# Patient Record
Sex: Male | Born: 1955 | Race: White | Hispanic: No | Marital: Married | Smoking: Former smoker
Health system: Southern US, Community
[De-identification: ages and names within clinical notes are randomized; demographics above are authoritative.]

## PROBLEM LIST (undated history)

## (undated) DIAGNOSIS — R319 Hematuria, unspecified: Secondary | ICD-10-CM

## (undated) DIAGNOSIS — L509 Urticaria, unspecified: Secondary | ICD-10-CM

## (undated) DIAGNOSIS — K219 Gastro-esophageal reflux disease without esophagitis: Secondary | ICD-10-CM

## (undated) DIAGNOSIS — Z950 Presence of cardiac pacemaker: Secondary | ICD-10-CM

## (undated) DIAGNOSIS — I10 Essential (primary) hypertension: Secondary | ICD-10-CM

## (undated) DIAGNOSIS — J45909 Unspecified asthma, uncomplicated: Secondary | ICD-10-CM

## (undated) DIAGNOSIS — G4730 Sleep apnea, unspecified: Secondary | ICD-10-CM

## (undated) DIAGNOSIS — M1990 Unspecified osteoarthritis, unspecified site: Secondary | ICD-10-CM

## (undated) HISTORY — PX: FOOT SURGERY: SHX648

## (undated) HISTORY — PX: INSERT / REPLACE / REMOVE PACEMAKER: SUR710

## (undated) HISTORY — PX: TONSILLECTOMY: SUR1361

## (undated) HISTORY — PX: APPENDECTOMY: SHX54

---

## 2004-12-24 ENCOUNTER — Emergency Department: Payer: Self-pay | Admitting: Emergency Medicine

## 2005-01-01 ENCOUNTER — Other Ambulatory Visit: Payer: Self-pay

## 2005-01-04 ENCOUNTER — Ambulatory Visit: Payer: Self-pay | Admitting: Podiatry

## 2006-12-22 ENCOUNTER — Ambulatory Visit: Payer: Self-pay | Admitting: Family Medicine

## 2007-04-17 ENCOUNTER — Ambulatory Visit: Payer: Self-pay | Admitting: Gastroenterology

## 2010-06-29 ENCOUNTER — Ambulatory Visit: Payer: Self-pay | Admitting: Gastroenterology

## 2010-07-04 LAB — PATHOLOGY REPORT

## 2011-11-07 ENCOUNTER — Ambulatory Visit: Payer: Self-pay | Admitting: Internal Medicine

## 2013-06-12 ENCOUNTER — Inpatient Hospital Stay: Payer: Self-pay | Admitting: Internal Medicine

## 2013-06-12 LAB — BASIC METABOLIC PANEL
Anion Gap: 4 — ABNORMAL LOW (ref 7–16)
Chloride: 107 mmol/L (ref 98–107)
Creatinine: 1.1 mg/dL (ref 0.60–1.30)
EGFR (African American): >60
EGFR (Non-African Amer.): >60
Glucose: 109 mg/dL — ABNORMAL HIGH (ref 65–99)
Potassium: 3.8 mmol/L (ref 3.5–5.1)

## 2013-06-12 LAB — CBC
HCT: 39.7 % — ABNORMAL LOW (ref 40.0–52.0)
MCH: 30.9 pg (ref 26.0–34.0)
MCHC: 35.4 g/dL (ref 32.0–36.0)
Platelet: 190 10*3/uL (ref 150–440)
RBC: 4.56 10*6/uL (ref 4.40–5.90)
WBC: 5.8 10*3/uL (ref 3.8–10.6)

## 2013-06-12 LAB — CK: CK, Total: 166 U/L (ref 35–232)

## 2013-06-12 LAB — T4, FREE: Free Thyroxine: 1.08 ng/dL (ref 0.76–1.46)

## 2013-06-13 DIAGNOSIS — I499 Cardiac arrhythmia, unspecified: Secondary | ICD-10-CM

## 2013-06-13 LAB — TROPONIN I: Troponin-I: <0.02 ng/mL

## 2013-06-13 LAB — LIPID PANEL
Cholesterol: 184 mg/dL
HDL Cholesterol: 36 mg/dL — ABNORMAL LOW
Ldl Cholesterol, Calc: 121 mg/dL — ABNORMAL HIGH
Triglycerides: 135 mg/dL
VLDL Cholesterol, Calc: 27 mg/dL

## 2013-06-13 LAB — CK TOTAL AND CKMB (NOT AT ARMC)
CK, Total: 138 U/L
CK, Total: 125 U/L
CK-MB: 1.3 ng/mL
CK-MB: 1.1 ng/mL

## 2013-06-14 LAB — CBC
HCT: 37.6 % — ABNORMAL LOW (ref 40.0–52.0)
HGB: 13.1 g/dL (ref 13.0–18.0)
MCH: 30.5 pg (ref 26.0–34.0)
MCHC: 34.9 g/dL (ref 32.0–36.0)
RBC: 4.3 10*6/uL — ABNORMAL LOW (ref 4.40–5.90)
RDW: 14.9 % — ABNORMAL HIGH (ref 11.5–14.5)
WBC: 7.4 10*3/uL (ref 3.8–10.6)

## 2013-06-14 LAB — BASIC METABOLIC PANEL
Calcium, Total: 8.7 mg/dL (ref 8.5–10.1)
Co2: 31 mmol/L (ref 21–32)
EGFR (African American): >60
Glucose: 99 mg/dL (ref 65–99)
Osmolality: 278 (ref 275–301)
Potassium: 3.5 mmol/L (ref 3.5–5.1)

## 2013-06-14 LAB — PROTIME-INR
INR: 1.1
Prothrombin Time: 14.1 secs (ref 11.5–14.7)

## 2013-06-15 LAB — CBC WITH DIFFERENTIAL/PLATELET
Basophil #: 0 10*3/uL (ref 0.0–0.1)
Eosinophil %: 1.4 %
HCT: 37.1 % — ABNORMAL LOW (ref 40.0–52.0)
Lymphocyte #: 1.4 10*3/uL (ref 1.0–3.6)
MCV: 86 fL (ref 80–100)
Monocyte #: 0.6 x10 3/mm (ref 0.2–1.0)
Neutrophil #: 4.4 10*3/uL (ref 1.4–6.5)
Neutrophil %: 67.2 %
Platelet: 188 10*3/uL (ref 150–440)
RDW: 15 % — ABNORMAL HIGH (ref 11.5–14.5)
WBC: 6.5 10*3/uL (ref 3.8–10.6)

## 2013-06-15 LAB — BASIC METABOLIC PANEL
Anion Gap: 5 — ABNORMAL LOW (ref 7–16)
BUN: 16 mg/dL (ref 7–18)
Calcium, Total: 8.8 mg/dL (ref 8.5–10.1)
Chloride: 103 mmol/L (ref 98–107)
Co2: 29 mmol/L (ref 21–32)
Creatinine: 1.08 mg/dL (ref 0.60–1.30)
EGFR (African American): >60
Osmolality: 275 (ref 275–301)
Sodium: 137 mmol/L (ref 136–145)

## 2013-06-15 LAB — PROTIME-INR
INR: 1.1
Prothrombin Time: 14.3 secs (ref 11.5–14.7)

## 2013-06-15 LAB — APTT: Activated PTT: 36 secs — ABNORMAL HIGH (ref 23.6–35.9)

## 2013-06-30 ENCOUNTER — Emergency Department: Payer: Self-pay | Admitting: Emergency Medicine

## 2013-06-30 LAB — CBC
HGB: 12.4 g/dL — ABNORMAL LOW (ref 13.0–18.0)
MCV: 88 fL (ref 80–100)
RBC: 4.09 10*6/uL — ABNORMAL LOW (ref 4.40–5.90)
RDW: 14.8 % — ABNORMAL HIGH (ref 11.5–14.5)
WBC: 7.6 10*3/uL (ref 3.8–10.6)

## 2013-06-30 LAB — BASIC METABOLIC PANEL
Anion Gap: 8 (ref 7–16)
BUN: 19 mg/dL — ABNORMAL HIGH (ref 7–18)
Chloride: 103 mmol/L (ref 98–107)
Co2: 26 mmol/L (ref 21–32)
Creatinine: 1.17 mg/dL (ref 0.60–1.30)
EGFR (Non-African Amer.): >60
Potassium: 3.4 mmol/L — ABNORMAL LOW (ref 3.5–5.1)
Sodium: 137 mmol/L (ref 136–145)

## 2013-06-30 LAB — URINALYSIS, COMPLETE
Bacteria: NONE SEEN
Blood: NEGATIVE
Ketone: NEGATIVE
Ph: 5 (ref 4.5–8.0)
Protein: NEGATIVE
RBC,UR: <1 /HPF (ref 0–5)
Specific Gravity: 1.024 (ref 1.003–1.030)
Squamous Epithelial: 1
WBC UR: <1 /HPF (ref 0–5)

## 2013-06-30 LAB — HEPATIC FUNCTION PANEL A (ARMC)
Alkaline Phosphatase: 72 U/L
Bilirubin, Direct: <0.1 mg/dL (ref 0.00–0.20)
Total Protein: 7.1 g/dL (ref 6.4–8.2)

## 2013-06-30 LAB — LIPASE, BLOOD: Lipase: 155 U/L (ref 73–393)

## 2013-07-01 ENCOUNTER — Ambulatory Visit: Payer: Self-pay | Admitting: Cardiology

## 2013-11-15 ENCOUNTER — Ambulatory Visit: Payer: Self-pay | Admitting: Family Medicine

## 2013-12-15 ENCOUNTER — Ambulatory Visit: Payer: Self-pay | Admitting: Family Medicine

## 2014-02-09 ENCOUNTER — Ambulatory Visit: Payer: Self-pay | Admitting: Unknown Physician Specialty

## 2014-02-09 LAB — CBC WITH DIFFERENTIAL/PLATELET
Basophil #: 0 10*3/uL (ref 0.0–0.1)
Basophil %: 0.6 %
EOS ABS: 0.1 10*3/uL (ref 0.0–0.7)
Eosinophil %: 1.4 %
HCT: 37.7 % — AB (ref 40.0–52.0)
HGB: 13.2 g/dL (ref 13.0–18.0)
LYMPHS ABS: 1.6 10*3/uL (ref 1.0–3.6)
LYMPHS PCT: 28.5 %
MCH: 31.9 pg (ref 26.0–34.0)
MCHC: 35 g/dL (ref 32.0–36.0)
MCV: 91 fL (ref 80–100)
MONO ABS: 0.4 x10 3/mm (ref 0.2–1.0)
Monocyte %: 7.9 %
NEUTROS PCT: 61.6 %
Neutrophil #: 3.4 10*3/uL (ref 1.4–6.5)
Platelet: 186 10*3/uL (ref 150–440)
RBC: 4.14 10*6/uL — ABNORMAL LOW (ref 4.40–5.90)
RDW: 14.5 % (ref 11.5–14.5)
WBC: 5.5 10*3/uL (ref 3.8–10.6)

## 2014-02-09 LAB — BASIC METABOLIC PANEL
Anion Gap: 5 — ABNORMAL LOW (ref 7–16)
BUN: 17 mg/dL (ref 7–18)
CHLORIDE: 106 mmol/L (ref 98–107)
CO2: 28 mmol/L (ref 21–32)
Calcium, Total: 8.5 mg/dL (ref 8.5–10.1)
Creatinine: 1.15 mg/dL (ref 0.60–1.30)
EGFR (Non-African Amer.): >60
Glucose: 85 mg/dL (ref 65–99)
OSMOLALITY: 278 (ref 275–301)
POTASSIUM: 4.1 mmol/L (ref 3.5–5.1)
Sodium: 139 mmol/L (ref 136–145)

## 2014-02-11 ENCOUNTER — Ambulatory Visit: Payer: Self-pay | Admitting: Podiatry

## 2014-11-18 NOTE — Op Note (Signed)
PATIENT NAME:  Edwin Ruiz, Edwin Ruiz MR#:  [id number] DATE OF BIRTH:  02-Nov-1955  DATE OF PROCEDURE:  06/15/2013  PRIMARY CARE PHYSICIAN:  Dr. Kary Kos.   PREPROCEDURE DIAGNOSIS: Type II second degree AV block.   PROCEDURE: Dual chamber pacemaker implantation.   POSTPROCEDURE DIAGNOSIS: Atrial and ventricular pacing.   INDICATION: The patient is a 59 year old gentleman who is admitted with a history of intermittent palpitations. In the Emergency Room, the patient was noted to have type II second degree AV block. The patient was hospitalized in telemetry, where he has had intermittent episodes of type II second degree AV block with bradycardia with rates in the 40s.   PROCEDURE:  The risks, benefits, and alternatives of permanent pacemaker implantation were explained to the patient and informed written consent was obtained. He was brought to the operating room in a fasting state. The left pectoral region was prepped and draped in the usual sterile manner. Anesthesia was obtained with 1% Xylocaine locally. A 6 cm incision was performed over the left pectoral region. The pacemaker pocket was generated by electrocautery and blunt dissection. Access was obtained to the left subclavian vein by fine needle aspiration. Right ventricular and right atrial leads were positioned in the right ventricular apex and right atrial appendage under fluoroscopic guidance. After proper thresholds were obtained, the leads were sutured in place. The pacemaker pocket was irrigated with gentamicin solution. The leads were connected to a dual chamber rate responsive pacemaker generator (Medtronic Adapta [id number]) and positioned into the pocket. The pocket was closed with 2-0 and 4-0 Vicryl, respectively. Steri-Strips and pressure dressing were applied.   ____________________________ Isaias Cowman, MD ap:dp D: 06/15/2013 15:08:26 ET T: 06/15/2013 16:29:07 ET JOB#: [id number]  cc: Isaias Cowman, MD, <Dictator> Isaias Cowman MD ELECTRONICALLY SIGNED 06/16/2013 9:35

## 2014-11-18 NOTE — H&P (Signed)
PATIENT NAME:  Edwin Ruiz, FADELEY MR#:  [id number] DATE OF BIRTH:  06-20-1956  DATE OF ADMISSION:  06/12/2013  PRIMARY CARE PHYSICIAN: Irven Easterly. Kary Kos, MD  CHIEF COMPLAINT: Palpitations, fluttering in the chest and chest tightness.   This is a 59 year old male with a history of sleep apnea, using a CPAP machine, and GERD who presents with the above complaint. The patient says this morning he has had palpitations, felt like his heart was fluttering. He then developed substernal chest pain/tightness, felt very bad. He was dizzy and lightheadedness, and he had a pressure sort of feeling in his head, so he came to the ER for further evaluation. In the ER, his first set of troponins was negative. EKG showed sinus rhythm with first degree AV block. However, since he has been here, we have also noticed some sinus pauses of about 1 second. The patient continues to have some dizziness and lightheadedness and pressure in his head. His blood pressure systolic is in the 045W to 190s, and normally he says it is in the 140s. Even this morning, the wife took his blood pressure where it was elevated in the 098J systolically.   REVIEW OF SYSTEMS:    CONSTITUTIONAL: No fever, fatigue, weakness, weight loss, weight gain.  EYES: No blurred or double vision, glaucoma or cataracts. EARS, NOSE, THROAT: No ear pain, hearing loss. Positive snoring. No postnasal drip, redness of oropharynx, difficulty swallowing. RESPIRATORY: No cough, wheezing, hemoptysis, COPD.  CARDIOVASCULAR: Positive chest tightness. Positive palpitations. No syncope, edema. Positive lightheadedness and dizziness. No dyspnea on exertion.  GASTROINTESTINAL:  No nausea, vomiting, diarrhea, abdominal pain, melena, or ulcers.  GENITOURINARY: No dysuria or hematuria.  ENDOCRINE: No polyuria or polydipsia.  HEMATOLOGIC AND LYMPHATIC: No anemia or easy bruising. SKIN: No rash or lesions.  MUSCULOSKELETAL: No redness or limited activity.  NEUROLOGIC: No  history of CVA, TIA, or seizures.  PSYCHIATRIC: No history of anxiety or depression.   PAST MEDICAL HISTORY: 1.  GERD.  2.  Sleep apnea.   ALLERGIES: No known drug allergies.   MEDICATIONS: Prilosec 20 mg daily.   PAST SURGICAL HISTORY:  1.  Appendectomy.  2.  Foot surgery.  FAMILY HISTORY: Positive for aortic aneurysm and hypertension. His mother died when he was young.   SOCIAL HISTORY: No tobacco, alcohol, or drug use.   PHYSICAL EXAMINATION: VITAL SIGNS: Temperature 97.6. Pulse is ranging about 37 to 58. When I was in the room, his pulse went down to the 30s, and at that time he was feeling lightheaded. Respirations 20. Blood pressure 180 to 192 over 80 to 97. O2 sat 98% on room air.  GENERAL: The patient is alert, oriented, not in acute distress.  HEENT: Head is atraumatic. Pupils are round and reactive. Sclerae anicteric. Mucous membranes are moist. Oropharynx is clear.  NECK: Very short. Hard to palpate thyromegaly due to his neck. Also hard to appreciate any carotid bruits.  CARDIOVASCULAR: Distant heart sounds without any murmur, gallops, or rubs. PMI is hard to palpate due to body habitus. It is regular rate and rhythm.  LUNGS: Clear to auscultation without crackles, rales, rhonchi. Good respiratory effort. Symmetrical expansion. No egophony or tactile fremitus.  GASTROINTESTINAL: No tenderness or masses. No hepatosplenomegaly. No hernia. Bowel sounds are equal x 4 quadrants. Abdomen is nondistended. No rebound, guarding, rigidity, or ecchymosis.  MUSCULOSKELETAL: No pathology to digits or nails. Extremities move x 4. No tenderness or effusion.  SKIN: Without rashes or lesions.  NEUROLOGIC: Cranial nerves II through XII  are grossly intact. DTRs are symmetric, upper and lower. Motor strength is 4 out of 4 bilateral upper and lower extremities.  GENITOURINARY: Deferred.   LABORATORY, DIAGNOSTIC AND RADIOLOGICAL DATA: Sodium 138, potassium 3.8, chloride 107, bicarbonate 27, BUN  18, creatinine 1.10, glucose 109. White blood cells 5.8, hemoglobin 14, hematocrit 40; platelets are 190. Troponin less than 0.02. CK is 166. Magnesium 1.8. TSH 3.20. Free T4 is 1.08. Chest x-ray shows cardiomegaly, no acute cardiopulmonary disease. EKG shows sinus rhythm with first degree AV block but as mentioned, while I was examining the patient and talking to the patient, the patient's heart rate went down into 30s.   ASSESSMENT AND PLAN: A 59 year old male with a history of sleep apnea and gastroesophageal reflux disease, who presented with palpitations, found to have a cardiac arrhythmia.  1.  Cardiac arrhythmia: The patient appears to have some sinus pauses. This is visualized here on the telemetry strip. The patient will therefore be admitted to stepdown unit. I have already spoken with Dr. Saralyn Pilar who will  see the patient in consultation. TSH is normal at this time. The patient is not on any beta blockers that cause arrhythmia with low heart rate. Will order an echocardiogram. Continue monitoring telemetry.  2.  Chest tightness: First set of troponins are negative. Will continue to monitor troponins, continue telemetry, and have started aspirin 81 mg. We will also check fasting lipids. Cardiology has been consulted.  3.  Sleep apnea: The patient's wife is to bring in the CPAP machine.  4.  Gastroesophageal reflux disease: Will continue PPI.  5.  Malignant hypertension: The patient is not on any medications at home. Therefore, I have started lisinopril 20 mg and written for hydralazine p.r.n. Will monitor blood pressure.  6.  The patient is full code status.    TIME SPENT: Approximately 50 minutes.     ____________________________ Donell Beers. Benjie Karvonen, MD spm:jcm D: 06/12/2013 17:10:13 ET T: 06/12/2013 17:59:03 ET JOB#: [id number]  cc: Avian Greenawalt P. Benjie Karvonen, MD, <Dictator> Irven Easterly. Kary Kos, MD Donell Beers Esteban Kobashigawa MD ELECTRONICALLY SIGNED 06/12/2013 20:27

## 2014-11-18 NOTE — Consult Note (Signed)
PATIENT NAME:  Edwin Ruiz, Edwin Ruiz MR#:  [id number] DATE OF BIRTH:  11/14/1955  DATE OF CONSULTATION:  06/13/2013  PRIMARY CARE PHYSICIAN: Dr. Kary Kos.    CONSULTING PHYSICIAN:  Isaias Cowman, MD  CHIEF COMPLAINT: Chest pain, palpitations, headache.   REASON FOR CONSULTATION: Consultation requested for evaluation of chest pain, bradycardia and heart block.   HISTORY OF PRESENT ILLNESS: The patient is a 59 year old gentleman with history of sleep apnea, on CPAP. The patient reports a history of intermittent palpitations. On the day of admission, the patient is complaining of substernal chest pain, intermittent lightheadedness, palpitations, severe headache with elevated blood pressure. The patient presented to [facility] Emergency Room where EKG revealed sinus rhythm, first degree AV block. Telemetry revealed episodes of type II second degree AV block at times with 2:1 conduction. The patient was noted to be hypertensive with a blood pressure of 180s to 190s. Complaining of intermittent chest discomfort described as tightness. The patient has ruled out for myocardial infarction by CPK, isoenzymes and troponin. The patient was admitted to the Critical Care Unit where he has had intermittent episodes of type II second degree AV block without  prolonged bradycardia.   PAST MEDICAL HISTORY: 1. Sleep apnea.  2. Gastroesophageal reflux disease   MEDICATIONS: Prilosec 1 daily.   SOCIAL HISTORY: The patient is married and resides with his wife. He has remote tobacco abuse history.   FAMILY HISTORY: No immediate family history of coronary artery disease or myocardial infarction.   REVIEW OF SYSTEMS:  CONSTITUTIONAL: No fever or chills.  EYES: No blurry vision.   EARS: No hearing loss.  RESPIRATORY: No shortness of breath.  CARDIOVASCULAR: Chest pain with palpitations and presyncope as described above.   GASTROINTESTINAL: No nausea, vomiting, diarrhea, or constipation.  GENITOURINARY: No dysuria or  hematuria.  ENDOCRINE: No polyuria or polydipsia.  HEMATOLOGICAL: No easy bruising or bleeding.  MUSCULOSKELETAL: No arthralgias or myalgias.  NEUROLOGICAL: The patient has had headache which appears to be associated with elevated blood pressure. Denies any focal muscle weakness or numbness.  PSYCHOLOGICAL: No depression or anxiety.   PHYSICAL EXAMINATION: VITAL SIGNS: Blood pressure 142/66, pulse 60, respirations 21, temperature 98.1, pulse oximetry 97%.  HEENT: Pupils equal, reactive to light and accommodation.  NECK: Supple without thyromegaly.  LUNGS: Clear.  HEART: Normal JVP. Normal PMI. Regular rate and rhythm. Normal S1, S2. No appreciable gallop, murmur, or rub.  ABDOMEN: Soft and nontender without hepatosplenomegaly.  EXTREMITIES: No cyanosis, clubbing, or edema. Pulses were intact bilaterally.  MUSCULOSKELETAL: Normal muscle tone.  NEUROLOGIC: The patient is alert and oriented x 3. Motor and sensory both grossly intact.   IMPRESSION: This is a 59 year old gentleman who presents with chest pain, negative cardiac  isoenzymes, palpitations associated with type II second degree atrioventricular block.   RECOMMENDATIONS: 1. Agree with overall current therapy.  2. Would defer full dose anticoagulation.  3. The patient would be unable to walk in a treadmill and with episodic type II second degree AV block is not a good candidate for pharmacological stress test. We will proceed to cardiac catheterization with selective coronary arteriography to definitive rule out underlying coronary artery disease. Pending results then we will proceed with dual chamber pacemaker implantation, likely for 06/15/2013.   ____________________________ Isaias Cowman, MD ap:sg D: 06/13/2013 09:52:16 ET T: 06/13/2013 11:48:51 ET JOB#: [id number]  cc: Isaias Cowman, MD, <Dictator>  Isaias Cowman MD ELECTRONICALLY SIGNED 06/16/2013 9:35

## 2014-11-18 NOTE — Discharge Summary (Signed)
PATIENT NAME:  Edwin Ruiz, Edwin Ruiz MR#:  [id number] DATE OF BIRTH:  May 16, 1956  DATE OF ADMISSION:  06/12/2013 DATE OF DISCHARGE:  06/16/2013  PRESENTING COMPLAINT: Chest pressure, dizziness.   DISCHARGE DIAGNOSES: 1.  Symptomatic bradycardia, second degree atrioventricular block.  2.  Obstructive sleep apnea.  3.  Morbid obesity.  4.  Gastroesophageal reflux disease.  PROCEDURES:  1.  Pacemaker placement by Dr. Saralyn Pilar on 06/15/2013.  2.  Cardiac catheterization done by Dr. Saralyn Pilar on 14 June 2013 showed normal coronaries, EF is 54%.  3.  Echo Doppler showed EF of 55% to 60%. Normal left ventricular systolic function. Moderate left ventricular hypertrophy. Severe increased left ventricular posterior wall thickness.   CODE STATUS: FULL CODE.   DISCHARGE MEDICATIONS: 1.  Prilosec your home dose p.o. daily.  2.  Aspirin 81 mg daily.  3.  Keflex 250 p.o. q. 6 hours for 5 more days.  4.  Acetaminophen/oxycodone 325/5 mg 1 tablet every 6 hours as needed.   DISCHARGE INSTRUCTIONS:  1.  Follow up with Dr. Saralyn Pilar in 1 to 2 weeks.  2.  Follow up with Dr. Kary Kos in 1 to 2 weeks.   LABORATORY DATA: CBC within normal limits. Basic metabolic panel within normal limits. Cardiac enzymes x3 negative. Lipid profile: LDL is 121. Cholesterol is 184. Triglycerides are 135. HDL is 36. Troponins x3 negative.   CONSULTANTS:  Cardiology consultation with Dr. Saralyn Pilar.   HISTORY AND HOSPITAL COURSE: Mr. Trainer is a 59 year old obese Caucasian gentleman with history of obstructive sleep apnea and GERD who comes in with palpitations, found to have cardiac arrhythmia. He was admitted with:  1.  Significant bradycardia secondary to second-degree AV block. The patient did have intermittent sinus pauses along with slow A-fib. Blood pressure remained stable. TSH was okay. The patient's electrolytes were stable. He was seen by Dr. Saralyn Pilar who recommended cardiac cath which showed normal coronaries, EF of  54%. The patient continued to brady down into the 20s mainly during the nighttime, which was suspected due to his severe sleep apnea spell. He underwent pacemaker placement on 06/15/2013, tolerated the procedure well, and remained in paced rhythm after the procedure. He will follow up with Dr. Saralyn Pilar as an outpatient.  2.  Chest tightness. Appears noncardiac. His cardiac enzymes were negative. He was continued on aspirin 81 mg daily. Lipid profile showed mildly increased LDL. Dietary changes were discussed.  3.  Sleep apnea. CPAP was continued. Advised weight reduction.  4.  GERD. Continued PPI.  5.  Elevated blood pressure without diagnosis of hypertension. Not on any BP meds at home. Blood pressure was stable. He was recommended to lose weight and continue low salt diet at home.   Hospital stay otherwise remained stable. The patient remained a FULL CODE.   TIME SPENT: 40 minutes.  ____________________________ Hart Rochester Posey Pronto, MD sap:sb D: 06/17/2013 07:05:11 ET T: 06/17/2013 07:23:53 ET JOB#: [id number]  cc: Charie Pinkus A. Posey Pronto, MD, <Dictator> Isaias Cowman, MD Irven Easterly. Kary Kos, MD Ilda Basset MD ELECTRONICALLY SIGNED 06/21/2013 14:15

## 2014-11-19 NOTE — Op Note (Signed)
PATIENT NAME:  Edwin Ruiz, Edwin Ruiz MR#:  [id number] DATE OF BIRTH:  [birth year]  DATE OF PROCEDURE:  02/11/2014  PREOPERATIVE DIAGNOSES:  1.  Hallux valgus deformity, right foot.  2.  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   POSTOPERATIVE DIAGNOSES:  1.  Hallux valgus deformity, right foot.  2.  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   PROCEDURES: 1.  Hallux valgus correction, right foot.  2.  Second metatarsal osteotomy, right foot.  3.  Third metatarsal osteotomy, right foot.  4.  Hammertoe correction, right 2nd toe.  5.  Hammertoe correction, right 3rd toe.   SURGEON: Durward Fortes, DPM  ANESTHESIA: Local MAC.   HEMOSTASIS: Pneumatic tourniquet, right ankle, 250 mmHg.   ESTIMATED BLOOD LOSS: 25 mL.   PATHOLOGY: None.   MATERIALS: Two 2.2 mm x 14 mm length Medartis screws and one 2.2 mm Medartis screw, 20 mm length.   COMPLICATIONS: None apparent.   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 He elects to have surgical correction after outpatient treatment has been unsuccessful.   OPERATIVE PROCEDURE: The patient was taken to the operating room and placed on the table in the supine position. Following satisfactory sedation, the right foot was anesthetized with 23 mL of 0.5% Sensorcaine plain around the 1st, 2nd, 3rd metatarsals. A pneumatic tourniquet was applied at the level of the right ankle and the foot was prepped and draped in the usual sterile fashion. The foot was exsanguinated and the tourniquet inflated to 250 mmHg.   Attention was then directed to the dorsal aspect of the right foot where an approximate 6 cm linear incision was made starting on the dorsum of the foot over the 2nd metatarsal and then extending on to the 2nd toe with a  small S portion over the metatarsal phalangeal joint. A similar incision, same length, was placed over the 3rd metatarsal and on to the 3rd toe. The incisions were deepened via sharp and blunt dissection in the toe portion, down to the level of the proximal interphalangeal joint, where a transverse tenotomy was performed and the tissues reflected off of the head of the proximal phalanx. The head of the proximal phalanx on both the 2nd and 3rd toes was then resected in toto using a pneumatic saw. Good release of the contracture on the toe. Attention was then directed back more proximal where dissection was carried down at the 3rd metatarsophalangeal joint down to the level of the joint. The capsular tissues were freed off of the joint and a linear capsulotomy was performed. The head of the metatarsal was exposed and freed from the surrounding tissue. Using a pneumatic saw, an osteotomy was placed beginning at the dorsal aspect of the articular surface and extending proximal as parallel as possible to the weight-bearing surface. The osteotomy was reduced and transposed slightly laterally. A K wire was then put in for stabilization followed by a 2.2 mm x 14 mm length Medartis screw using standard technique. Intraoperative FluoroScan views revealed good alignment of the metatarsal with the screw placement. Next, attention was then directed to the 2nd metatarsal incision where this was deepened down to the level of the joint and the capsular tissues freed and a linear capsulotomy was performed revealing the head of the 2nd metatarsal. The  exact same type of osteotomy was performed at the dorsal aspect, at the articular surface junction and extending parallel to the weight-bearing surface proximal. The osteotomy was then reduced and transposed laterally and this was again fixated using a K wire and then fixated using a 2.2 mm x 14 mm length Medartis screw. Intraoperative FluoroScan views revealed good reduction of the  deformities and K wire and screw placement. The wounds were then flushed with copious amounts of sterile saline and a capsule tightening was performed with a 3-0 Vicryl figure-of-eight suture along the medial aspect of both the 2nd and 3rd metatarsophalangeal joints. The remainder of the wounds were then closed using 4-0 Vicryl running suture for all layers from periosteal and capsular closure to deep and superficial subcutaneous closure and skin closure. At this point, the tourniquet was released to allow reperfusion of the foot. The tourniquet remained down for 22 minutes. Some lidocaine with epinephrine 1:100,000 was then injected over the incision area, at the first metatarsal, for the hallux valgus correction, and an approximate 4 cm linear incision was made over the 1st metatarsophalangeal joint. The incision was deepened via sharp and blunt dissection down to the level of the joint. The capsular and periosteal tissues were reflected off of the dorsal and medial aspect of the joint. At this point, the tourniquet was reinflated. Next, the medial eminence was resected using a pneumatic saw and a 0.045 inch K wire was then driven from medial to lateral as an axis guide. A V-shaped Austin type osteotomy was then performed through the metatarsal head and the guide pin was removed. The osteotomy was transposed laterally and then fixated with a pin and a 2.2 Medartis 20 mm length screw. Intraoperative FluoroScan views revealed a good metatarsal parabola and fixation of all of the osteotomies. The redundant medial eminence was resected and the edges were rasped smooth. The wound was then flushed with copious amounts of sterile saline and closed using 4-0 Vicryl running suture for all layers from deep and superficial subcutaneous and capsular and periosteal closure to skin closure. Tincture of benzoin and Steri-Strips applied to all of the incisions followed by 4 x 4's and sterile bandages. The tourniquet was released  and blood flow noted to return to all digits. A posterior plaster splint was then applied with the foot 90 degrees relative to the leg followed by an Ace wrap. The patient tolerated the procedure and anesthesia well and was transported to the PAC-U with vital signs stable and in good condition.   ____________________________ Sharlotte Alamo, DPM tc:sb D: 02/11/2014 11:23:00 ET T: 02/11/2014 13:41:13 ET JOB#: [id number]  cc: Sharlotte Alamo, DPM, <Dictator> Thania Woodlief DPM ELECTRONICALLY SIGNED 02/14/2014 8:19

## 2016-02-05 ENCOUNTER — Ambulatory Visit: Payer: 59 | Admitting: Anesthesiology

## 2016-02-05 ENCOUNTER — Ambulatory Visit
Admission: RE | Admit: 2016-02-05 | Discharge: 2016-02-05 | Disposition: A | Discharge status: Home / Self Care | Payer: 59 | Source: Ambulatory Visit | Attending: Gastroenterology | Admitting: Gastroenterology

## 2016-02-05 ENCOUNTER — Encounter
Admission: RE | Disposition: A | Discharge status: Home / Self Care | Payer: Self-pay | Source: Ambulatory Visit | Attending: Gastroenterology

## 2016-02-05 DIAGNOSIS — Z79899 Other long term (current) drug therapy: Secondary | ICD-10-CM | POA: Insufficient documentation

## 2016-02-05 DIAGNOSIS — D124 Benign neoplasm of descending colon: Secondary | ICD-10-CM | POA: Insufficient documentation

## 2016-02-05 DIAGNOSIS — Z86010 Personal history of colonic polyps: Secondary | ICD-10-CM | POA: Insufficient documentation

## 2016-02-05 DIAGNOSIS — Z6841 Body Mass Index (BMI) 40.0 and over, adult: Secondary | ICD-10-CM | POA: Insufficient documentation

## 2016-02-05 DIAGNOSIS — D122 Benign neoplasm of ascending colon: Secondary | ICD-10-CM | POA: Insufficient documentation

## 2016-02-05 DIAGNOSIS — K621 Rectal polyp: Secondary | ICD-10-CM | POA: Insufficient documentation

## 2016-02-05 DIAGNOSIS — G4730 Sleep apnea, unspecified: Secondary | ICD-10-CM | POA: Diagnosis not present

## 2016-02-05 DIAGNOSIS — Z87891 Personal history of nicotine dependence: Secondary | ICD-10-CM | POA: Diagnosis not present

## 2016-02-05 DIAGNOSIS — D125 Benign neoplasm of sigmoid colon: Secondary | ICD-10-CM | POA: Diagnosis present

## 2016-02-05 DIAGNOSIS — J45909 Unspecified asthma, uncomplicated: Secondary | ICD-10-CM | POA: Insufficient documentation

## 2016-02-05 DIAGNOSIS — Z950 Presence of cardiac pacemaker: Secondary | ICD-10-CM | POA: Diagnosis not present

## 2016-02-05 DIAGNOSIS — M1990 Unspecified osteoarthritis, unspecified site: Secondary | ICD-10-CM | POA: Insufficient documentation

## 2016-02-05 DIAGNOSIS — K5730 Diverticulosis of large intestine without perforation or abscess without bleeding: Secondary | ICD-10-CM | POA: Diagnosis not present

## 2016-02-05 DIAGNOSIS — Z880 Allergy status to penicillin: Secondary | ICD-10-CM | POA: Diagnosis not present

## 2016-02-05 DIAGNOSIS — K219 Gastro-esophageal reflux disease without esophagitis: Secondary | ICD-10-CM | POA: Diagnosis not present

## 2016-02-05 DIAGNOSIS — I10 Essential (primary) hypertension: Secondary | ICD-10-CM | POA: Diagnosis not present

## 2016-02-05 HISTORY — DX: Presence of cardiac pacemaker: Z95.0

## 2016-02-05 HISTORY — DX: Unspecified osteoarthritis, unspecified site: M19.90

## 2016-02-05 HISTORY — DX: Sleep apnea, unspecified: G47.30

## 2016-02-05 HISTORY — DX: Hematuria, unspecified: R31.9

## 2016-02-05 HISTORY — DX: Unspecified asthma, uncomplicated: J45.909

## 2016-02-05 HISTORY — DX: Gastro-esophageal reflux disease without esophagitis: K21.9

## 2016-02-05 HISTORY — DX: Essential (primary) hypertension: I10

## 2016-02-05 HISTORY — DX: Urticaria, unspecified: L50.9

## 2016-02-05 HISTORY — PX: COLONOSCOPY WITH PROPOFOL: SHX5780

## 2016-02-05 SURGERY — COLONOSCOPY WITH PROPOFOL
Anesthesia: General

## 2016-02-05 MED ORDER — SODIUM CHLORIDE 0.9 % IV SOLN: INTRAVENOUS | Status: DC

## 2016-02-05 MED ORDER — PROPOFOL 10 MG/ML IV BOLUS
INTRAVENOUS | Status: DC | PRN
  Administered 2016-02-05: 60 mg via INTRAVENOUS

## 2016-02-05 MED ORDER — MIDAZOLAM HCL 2 MG/2ML IJ SOLN
INTRAMUSCULAR | Status: DC | PRN
  Administered 2016-02-05: 1 mg via INTRAVENOUS

## 2016-02-05 MED ORDER — PROPOFOL 500 MG/50ML IV EMUL
INTRAVENOUS | Status: DC | PRN
  Administered 2016-02-05: 140 ug/kg/min via INTRAVENOUS

## 2016-02-05 MED ORDER — SODIUM CHLORIDE 0.9 % IV SOLN
INTRAVENOUS | Status: DC
  Administered 2016-02-05: 1000 mL via INTRAVENOUS

## 2016-02-05 MED ORDER — LIDOCAINE HCL (CARDIAC) 20 MG/ML IV SOLN
INTRAVENOUS | Status: DC | PRN
  Administered 2016-02-05: 60 mg via INTRAVENOUS

## 2016-02-05 NOTE — Op Note (Signed)
[facility] Gastroenterology Patient Name: Edwin Ruiz Procedure Date: 02/05/2016 10:33 AM MRN: [id number] Account #: [id number] Date of Birth: Jan 07, 1956 Admit Type: Outpatient Age: 60 Room: [facility] ENDO ROOM 1 Gender: Male Note Status: Finalized Procedure:            Colonoscopy Indications:          Personal history of colonic polyps Providers:            Lollie Sails, MD Referring MD:         Irven Easterly. Kary Kos, MD (Referring MD) Medicines:            Monitored Anesthesia Care Complications:        No immediate complications. Procedure:            Pre-Anesthesia Assessment:                       - ASA Grade Assessment: III - A patient with severe                        systemic disease.                       After obtaining informed consent, the colonoscope was                        passed under direct vision. Throughout the procedure,                        the patient's blood pressure, pulse, and oxygen                        saturations were monitored continuously. The                        Colonoscope was introduced through the anus with the                        intention of advancing to the cecum. The scope was                        advanced to the ascending colon before the procedure                        was aborted. The IC vaslve was ealily appreciated in                        the distant lumen, and the base of the cecum was seen                        on one occasion as well, but despite multiple maneuvers                        the scope could not be advanced into the cecum. No                        larger lesions were noted in the proximal ascending or                        cecum area. Medications were given. The quality of  the                        bowel preparation was good. Findings:      A 3 mm polyp was found in the sigmoid colon. The polyp was sessile. The       polyp was removed with a cold biopsy forceps. Resection and  retrieval       were complete.      A 1 mm polyp was found in the ascending colon. The polyp was sessile.       The polyp was removed with a cold biopsy forceps. Resection and       retrieval were complete.      A 4 mm polyp was found in the descending colon. The polyp was sessile.       The polyp was removed with a cold biopsy forceps. Resection and       retrieval were complete.      A 3 mm polyp was found in the mid sigmoid colon. The polyp was sessile.       The polyp was removed with a cold biopsy forceps. Resection and       retrieval were complete.      Three sessile polyps were found in the rectum. The polyps were less than       1 mm in size. These polyps were removed with a cold biopsy forceps.       Resection and retrieval were complete.      The digital rectal exam was normal.      A few small-mouthed diverticula were found in the sigmoid colon and       descending colon. Impression:           - One 3 mm polyp in the sigmoid colon, removed with a                        cold biopsy forceps. Resected and retrieved.                       - One 1 mm polyp in the ascending colon, removed with a                        cold biopsy forceps. Resected and retrieved.                       - One 4 mm polyp in the descending colon, removed with                        a cold biopsy forceps. Resected and retrieved.                       - One 3 mm polyp in the mid sigmoid colon, removed with                        a cold biopsy forceps. Resected and retrieved.                       - Three less than 1 mm polyps in the rectum, removed                        with a cold biopsy forceps. Resected and retrieved.                       -  Diverticulosis in the sigmoid colon and in the                        descending colon. Recommendation:       - Discharge patient to home. Procedure Code(s):    --- Professional ---                       [phone], Colonoscopy, flexible; with biopsy, single or                         multiple Diagnosis Code(s):    --- Professional ---                       D12.2, Benign neoplasm of ascending colon                       D12.4, Benign neoplasm of descending colon                       D12.5, Benign neoplasm of sigmoid colon                       K62.1, Rectal polyp                       Z86.010, Personal history of colonic polyps                       K57.30, Diverticulosis of large intestine without                        perforation or abscess without bleeding CPT copyright 2016 American Medical Association. All rights reserved. The codes documented in this report are preliminary and upon coder review may  be revised to meet current compliance requirements. Lollie Sails, MD 02/05/2016 11:26:14 AM This report has been signed electronically. Number of Addenda: 0 Note Initiated On: 02/05/2016 10:33 AM Scope Withdrawal Time: 0 hours 11 minutes 54 seconds  Total Procedure Duration: 0 hours 37 minutes 17 seconds       [facility]

## 2016-02-05 NOTE — H&P (Signed)
Outpatient short stay form Pre-procedure 02/05/2016 10:36 AM Lollie Sails MD  Primary Physician: Dr. Maryland Pink  Reason for visit:  Colonoscopy  History of present illness:  Patient is a 60 year old male presenting today for colonoscopy. His last colonoscopy was in 2011. He has a previous history of adenomatous colon polyps. He tolerated his prep well. He denies use of any aspirin or blood thinning agents. He does have a pacemaker.    Current facility-administered medications:  .  0.9 %  sodium chloride infusion, , Intravenous, Continuous, Lollie Sails, MD, Last Rate: 20 mL/hr at 02/05/16 1022, 1,000 mL at 02/05/16 1022 .  0.9 %  sodium chloride infusion, , Intravenous, Continuous, Lollie Sails, MD  Prescriptions prior to admission  Medication Sig Dispense Refill Last Dose  . acetaminophen (TYLENOL) 500 MG tablet Take 500 mg by mouth every 6 (six) hours as needed.     . etodolac (LODINE) 500 MG tablet Take 500 mg by mouth 2 (two) times daily.     Marland Kitchen HYDROcodone-acetaminophen (NORCO/VICODIN) 5-325 MG tablet Take 1 tablet by mouth every 6 (six) hours as needed for moderate pain.     Marland Kitchen losartan (COZAAR) 100 MG tablet Take 100 mg by mouth daily.   02/05/2016 at 0600  . omeprazole (PRILOSEC) 20 MG capsule Take 20 mg by mouth daily.     . tadalafil (CIALIS) 20 MG tablet Take 20 mg by mouth daily as needed for erectile dysfunction.        Allergies  Allergen Reactions  . Penicillins      Past Medical History  Diagnosis Date  . Arthritis   . Asthma   . GERD (gastroesophageal reflux disease)   . Sleep apnea   . Urticaria   . Hematuria   . Presence of permanent cardiac pacemaker   . Hypertension     Review of systems:      Physical Exam    Heart and lungs: Regular rate and rhythm without rub or gallop, lungs are bilaterally clear.    HEENT: Normal cephalic atraumatic eyes are anicteric    Other:     Pertinant exam for procedure: Soft nontender  nondistended, obese, bowel sounds are positive.    Planned proceedures: Colonoscopy and indicated procedures. I have discussed the risks benefits and complications of procedures to include not limited to bleeding, infection, perforation and the risk of sedation and the patient wishes to proceed.    Lollie Sails, MD Gastroenterology 02/05/2016  10:36 AM

## 2016-02-05 NOTE — Anesthesia Preprocedure Evaluation (Addendum)
Anesthesia Evaluation  Patient identified by MRN, date of birth, ID band Patient awake    Reviewed: Allergy & Precautions, H&P , NPO status , Patient's Chart, lab work & pertinent test results, reviewed documented beta blocker date and time   Airway Mallampati: III   Neck ROM: full    Dental  (+) Poor Dentition, Teeth Intact   Pulmonary neg pulmonary ROS, asthma , sleep apnea and Continuous Positive Airway Pressure Ventilation , former smoker,    Pulmonary exam normal        Cardiovascular Exercise Tolerance: Good hypertension, negative cardio ROS Normal cardiovascular exam+ pacemaker  Rhythm:regular Rate:Normal     Neuro/Psych negative neurological ROS  negative psych ROS   GI/Hepatic negative GI ROS, Neg liver ROS, GERD  Medicated,  Endo/Other  negative endocrine ROSMorbid obesity  Renal/GU negative Renal ROS  negative genitourinary   Musculoskeletal   Abdominal   Peds  Hematology negative hematology ROS (+)   Anesthesia Other Findings Past Medical History:   Arthritis                                                    Asthma                                                       GERD (gastroesophageal reflux disease)                       Sleep apnea                                                  Urticaria                                                    Hematuria                                                    Presence of permanent cardiac pacemaker                      Hypertension                                               Past Surgical History:   TONSILLECTOMY                                                 FOOT SURGERY  INSERT / REPLACE / REMOVE PACEMAKER                         BMI    Body Mass Index   45.05 kg/m 2     Reproductive/Obstetrics                            Anesthesia Physical Anesthesia Plan  ASA:  III  Anesthesia Plan: General   Post-op Pain Management:    Induction:   Airway Management Planned:   Additional Equipment:   Intra-op Plan:   Post-operative Plan:   Informed Consent: I have reviewed the patients History and Physical, chart, labs and discussed the procedure including the risks, benefits and alternatives for the proposed anesthesia with the patient or authorized representative who has indicated his/her understanding and acceptance.   Dental Advisory Given  Plan Discussed with: CRNA  Anesthesia Plan Comments:         Anesthesia Quick Evaluation

## 2016-02-05 NOTE — Transfer of Care (Signed)
Immediate Anesthesia Transfer of Care Note  Patient: Edwin Ruiz  Procedure(s) Performed: Procedure(s): COLONOSCOPY WITH PROPOFOL (N/A)  Patient Location: Endoscopy Unit  Anesthesia Type:General  Level of Consciousness: awake, alert , oriented and patient cooperative  Airway & Oxygen Therapy: Patient Spontanous Breathing and Patient connected to nasal cannula oxygen  Post-op Assessment: Report given to RN, Post -op Vital signs reviewed and stable and Patient moving all extremities X 4  Post vital signs: Reviewed and stable  Last Vitals:  Filed Vitals:   02/05/16 1006 02/05/16 1009  BP:  118/68  Pulse: 60   Temp: 36.3 C   Resp: 16     Last Pain: There were no vitals filed for this visit.       Complications: No apparent anesthesia complications

## 2016-02-06 ENCOUNTER — Encounter: Payer: Self-pay | Admitting: Gastroenterology

## 2016-02-06 LAB — SURGICAL PATHOLOGY

## 2016-02-12 NOTE — Anesthesia Postprocedure Evaluation (Signed)
Anesthesia Post Note  Patient: Edwin Ruiz  Procedure(s) Performed: Procedure(s) (LRB): COLONOSCOPY WITH PROPOFOL (N/A)  Patient location during evaluation: PACU Anesthesia Type: General Level of consciousness: awake and alert Pain management: pain level controlled Vital Signs Assessment: post-procedure vital signs reviewed and stable Respiratory status: spontaneous breathing, nonlabored ventilation, respiratory function stable and patient connected to nasal cannula oxygen Cardiovascular status: blood pressure returned to baseline and stable Postop Assessment: no signs of nausea or vomiting Anesthetic complications: no    Last Vitals:  Filed Vitals:   02/05/16 1140 02/05/16 1150  BP: 110/51 98/79  Pulse: 59 60  Temp:    Resp: 14 16    Last Pain:  Filed Vitals:   02/06/16 0801  PainSc: 0-No pain                 [name]

## 2016-03-04 ENCOUNTER — Emergency Department: Payer: 59

## 2016-03-04 ENCOUNTER — Emergency Department
Admission: EM | Admit: 2016-03-04 | Discharge: 2016-03-04 | Disposition: A | Discharge status: Home / Self Care | Payer: 59 | Attending: Emergency Medicine | Admitting: Emergency Medicine

## 2016-03-04 ENCOUNTER — Encounter: Payer: Self-pay | Admitting: Emergency Medicine

## 2016-03-04 DIAGNOSIS — Z791 Long term (current) use of non-steroidal anti-inflammatories (NSAID): Secondary | ICD-10-CM | POA: Insufficient documentation

## 2016-03-04 DIAGNOSIS — Z87891 Personal history of nicotine dependence: Secondary | ICD-10-CM | POA: Diagnosis not present

## 2016-03-04 DIAGNOSIS — J45909 Unspecified asthma, uncomplicated: Secondary | ICD-10-CM | POA: Diagnosis not present

## 2016-03-04 DIAGNOSIS — I10 Essential (primary) hypertension: Secondary | ICD-10-CM | POA: Insufficient documentation

## 2016-03-04 DIAGNOSIS — R1012 Left upper quadrant pain: Secondary | ICD-10-CM | POA: Insufficient documentation

## 2016-03-04 DIAGNOSIS — R0789 Other chest pain: Secondary | ICD-10-CM | POA: Diagnosis present

## 2016-03-04 DIAGNOSIS — Z950 Presence of cardiac pacemaker: Secondary | ICD-10-CM | POA: Diagnosis not present

## 2016-03-04 LAB — HEPATIC FUNCTION PANEL
ALT: 9 U/L — AB (ref 17–63)
AST: 18 U/L (ref 15–41)
Albumin: 3.8 g/dL (ref 3.5–5.0)
Alkaline Phosphatase: 65 U/L (ref 38–126)
BILIRUBIN DIRECT: 0.1 mg/dL (ref 0.1–0.5)
BILIRUBIN INDIRECT: 0.3 mg/dL (ref 0.3–0.9)
BILIRUBIN TOTAL: 0.4 mg/dL (ref 0.3–1.2)
Total Protein: 6.7 g/dL (ref 6.5–8.1)

## 2016-03-04 LAB — CBC
HEMATOCRIT: 36.9 % — AB (ref 40.0–52.0)
Hemoglobin: 13.2 g/dL (ref 13.0–18.0)
MCH: 32 pg (ref 26.0–34.0)
MCHC: 35.7 g/dL (ref 32.0–36.0)
MCV: 89.5 fL (ref 80.0–100.0)
Platelets: 162 10*3/uL (ref 150–440)
RBC: 4.13 MIL/uL — ABNORMAL LOW (ref 4.40–5.90)
RDW: 13.9 % (ref 11.5–14.5)
WBC: 6.6 10*3/uL (ref 3.8–10.6)

## 2016-03-04 LAB — BASIC METABOLIC PANEL
ANION GAP: 9 (ref 5–15)
BUN: 20 mg/dL (ref 6–20)
CO2: 24 mmol/L (ref 22–32)
Calcium: 8.5 mg/dL — ABNORMAL LOW (ref 8.9–10.3)
Chloride: 104 mmol/L (ref 101–111)
Creatinine, Ser: 1.17 mg/dL (ref 0.61–1.24)
Glucose, Bld: 139 mg/dL — ABNORMAL HIGH (ref 65–99)
POTASSIUM: 3.4 mmol/L — AB (ref 3.5–5.1)
SODIUM: 137 mmol/L (ref 135–145)

## 2016-03-04 LAB — LIPASE, BLOOD: Lipase: 25 U/L (ref 11–51)

## 2016-03-04 LAB — TROPONIN I: Troponin I: <0.03 ng/mL (ref <0.03)

## 2016-03-04 MED ORDER — HYDROMORPHONE HCL 1 MG/ML IJ SOLN
1.0000 mg | Freq: Once | INTRAMUSCULAR | Status: AC
  Administered 2016-03-04: 1 mg via INTRAVENOUS

## 2016-03-04 MED ORDER — SUCRALFATE 1 G PO TABS: 1.0000 g | ORAL_TABLET | Freq: Four times a day (QID) | ORAL | 0 refills | Status: DC

## 2016-03-04 MED ORDER — IOPAMIDOL (ISOVUE-300) INJECTION 61%
150.0000 mL | Freq: Once | INTRAVENOUS | Status: AC | PRN
  Administered 2016-03-04: 150 mL via INTRAVENOUS

## 2016-03-04 MED ORDER — DIATRIZOATE MEGLUMINE & SODIUM 66-10 % PO SOLN
15.0000 mL | Freq: Once | ORAL | Status: AC
  Administered 2016-03-04: 15 mL via ORAL

## 2016-03-04 MED ORDER — ONDANSETRON HCL 4 MG/2ML IJ SOLN
4.0000 mg | Freq: Once | INTRAMUSCULAR | Status: AC
  Administered 2016-03-04: 4 mg via INTRAVENOUS

## 2016-03-04 MED ORDER — HYDROMORPHONE HCL 1 MG/ML IJ SOLN
INTRAMUSCULAR | Status: AC
  Filled 2016-03-04: qty 1

## 2016-03-04 MED ORDER — ONDANSETRON HCL 4 MG/2ML IJ SOLN
INTRAMUSCULAR | Status: AC
  Filled 2016-03-04: qty 2

## 2016-03-04 NOTE — ED Provider Notes (Signed)
Time Seen: Approximately 0200  I have reviewed the triage notes  Chief Complaint: Chest Pain and Abdominal Pain   History of Present Illness: Edwin Ruiz is a 60 y.o. male who presents with rather sudden onset of some left-sided upper abdominal pain and possibly lower chest discomfort. To this historian he points mainly to the left middle quadrant area as the source of discomfort. He states he has difficulty lying flat due to the discomfort. He reports some pleuritic component with no true shortness of breath. He is nauseated with no vomiting. He denies any midline back pain or flank discomfort. He denies any right upper or right lower quadrant abdominal discomfort. He denies any melena or hematochezia, fever, chills, productive cough or wheezing.   Past Medical History:  Diagnosis Date  . Arthritis   . Asthma   . GERD (gastroesophageal reflux disease)   . Hematuria   . Hypertension   . Presence of permanent cardiac pacemaker   . Sleep apnea   . Urticaria     There are no active problems to display for this patient.   Past Surgical History:  Procedure Laterality Date  . APPENDECTOMY    . COLONOSCOPY WITH PROPOFOL N/A 02/05/2016   Procedure: COLONOSCOPY WITH PROPOFOL;  Surgeon: Lollie Sails, MD;  Location: [facility] ENDOSCOPY;  Service: Endoscopy;  Laterality: N/A;  . FOOT SURGERY    . INSERT / REPLACE / REMOVE PACEMAKER    . TONSILLECTOMY      Past Surgical History:  Procedure Laterality Date  . APPENDECTOMY    . COLONOSCOPY WITH PROPOFOL N/A 02/05/2016   Procedure: COLONOSCOPY WITH PROPOFOL;  Surgeon: Lollie Sails, MD;  Location: [facility] ENDOSCOPY;  Service: Endoscopy;  Laterality: N/A;  . FOOT SURGERY    . INSERT / REPLACE / REMOVE PACEMAKER    . TONSILLECTOMY      Current Outpatient Rx  . Order #: [id number] Class: Historical Med  . Order #: [id number] Class: Historical Med  . Order #: [id number] Class: Historical Med  . Order #: [id number] Class: Historical Med  .  Order #: [id number] Class: Historical Med  . Order #: [id number] Class: Print  . Order #: [id number] Class: Historical Med    Allergies:  Penicillins  Family History: History reviewed. No pertinent family history.  Social History: Social History  Substance Use Topics  . Smoking status: Former Research scientist (life sciences)  . Smokeless tobacco: Never Used  . Alcohol use Yes     Comment: occasional     Review of Systems:   10 point review of systems was performed and was otherwise negative:  Constitutional: No fever Eyes: No visual disturbances ENT: No sore throat, ear pain Cardiac: No chest painTo this historian. He denies any arm or jaw pain or upper thoracic pain Respiratory: No shortness of breath, wheezing, or stridor Abdomen: Patient points to the left middle quadrant abdominal area with no history of vomiting or diarrhea Endocrine: No weight loss, No night sweats Extremities: No peripheral edema, cyanosis Skin: No rashes, easy bruising Neurologic: No focal weakness, trouble with speech or swollowing Urologic: No dysuria, Hematuria, or urinary frequency  Physical Exam:  ED Triage Vitals  Enc Vitals Group     BP 03/04/16 0159 (!) 144/77     Pulse Rate 03/04/16 0159 60     Resp 03/04/16 0159 18     Temp 03/04/16 0159 97.9 F (36.6 C)     Temp Source 03/04/16 0159 Oral     SpO2 03/04/16 0159 100 %     Weight  03/04/16 0159 (!) 370 lb (167.8 kg)     Height 03/04/16 0159 6\' 4"  (1.93 m)     Head Circumference --      Peak Flow --      Pain Score 03/04/16 0200 9     Pain Loc --      Pain Edu? --      Excl. in [place]? --     General: Awake , Alert , and Oriented times 3; GCS 15 Appears uncomfortable  Head: Normal cephalic , atraumatic Eyes: Pupils equal , round, reactive to light Nose/Throat: No nasal drainage, patent upper airway without erythema or exudate.  Neck: Supple, Full range of motion, No anterior adenopathy or palpable thyroid masses Lungs: Clear to ascultation without wheezes ,  rhonchi, or rales Heart: Regular rate, regular rhythm without murmurs , gallops , or rubs Abdomen: Morbidly obese tenderness toward the left middle quadrant without rebound, guarding , or rigidity; bowel sounds positive and symmetric in all 4 quadrants. No organomegaly .       No palpable masses Extremities: 2 plus symmetric pulses. No edema, clubbing or cyanosis Neurologic: normal ambulation, Motor symmetric without deficits, sensory intact Skin: warm, dry, no rashes   Labs:   All laboratory work was reviewed including any pertinent negatives or positives listed below:  Labs Reviewed  BASIC METABOLIC PANEL - Abnormal; Notable for the following:       Result Value   Potassium 3.4 (*)    Glucose, Bld 139 (*)    Calcium 8.5 (*)    All other components within normal limits  CBC - Abnormal; Notable for the following:    RBC 4.13 (*)    HCT 36.9 (*)    All other components within normal limits  HEPATIC FUNCTION PANEL - Abnormal; Notable for the following:    ALT 9 (*)    All other components within normal limits  TROPONIN I  LIPASE, BLOOD  Laboratory work was reviewed and showed no clinically significant abnormalities.   EKG:  ED ECG REPORT I, Daymon Larsen, the attending physician, personally viewed and interpreted this ECG.  Date: 03/04/2016 EKG Time: 0157 Rate: 60 Rhythm: AV sequential dual chambered pacemaker QRS Axis: normal Intervals: normal ST/T Wave abnormalities: normal Conduction Disturbances: none Narrative Interpretation: unremarkable No acute ischemic changes   Radiology:   CLINICAL DATA:  Acute onset of upper abdominal and lower chest pain, radiating to the back. Shortness of breath and diaphoresis. Nausea. Initial encounter.  EXAM: CT ABDOMEN AND PELVIS WITH CONTRAST  TECHNIQUE: Multidetector CT imaging of the abdomen and pelvis was performed using the standard protocol following bolus administration of intravenous contrast.  CONTRAST:  163mL  ISOVUE-300 IOPAMIDOL (ISOVUE-300) INJECTION 61%  COMPARISON:  Abdominal ultrasound performed 07/01/2013  FINDINGS: The visualized lung bases are clear.  A 2.4 cm cyst is noted at the anterior aspect of the left hepatic lobe. The liver and spleen are otherwise unremarkable. A small stone is seen within the gallbladder. The gallbladder is otherwise unremarkable. The pancreas and adrenal glands are unremarkable.  Small bilateral renal cysts are noted. The kidneys are otherwise unremarkable. There is no evidence of hydronephrosis. Nonspecific perinephric stranding is noted bilaterally. No renal or ureteral stones are seen.  No free fluid is identified. The small bowel is unremarkable in appearance. The stomach is within normal limits. No acute vascular abnormalities are seen.  The patient is status post appendectomy. The colon is largely decompressed and not well assessed. There is no evidence of  diverticulosis.  The bladder is mildly distended and grossly unremarkable. The prostate is normal in size. No inguinal lymphadenopathy is seen.  No acute osseous abnormalities are identified. Mild degenerative change is noted along the lower lumbar spine.  IMPRESSION: 1. No acute abnormality seen within the abdomen or pelvis. 2. Small hepatic cyst and renal cysts.   Electronically Signed   By: Garald Balding M.D.   On: 03/04/2016 03:42  CLINICAL DATA:  Acute onset of upper abdominal and lower chest pain, radiating to the back. Shortness of breath and diaphoresis. Nausea. Initial encounter.  EXAM: CHEST  2 VIEW  COMPARISON:  Chest radiograph from 06/30/2013  FINDINGS: The lungs are well-aerated. Mild vascular congestion is noted. Mild right basilar atelectasis is noted. There is no evidence of pleural effusion or pneumothorax.  The heart is normal in size; the mediastinal contour is within normal limits. A pacemaker is noted at the left chest wall, with leads  ending at the right atrium and right ventricle. No acute osseous abnormalities are seen.  IMPRESSION: Mild vascular congestion noted. Mild right basilar atelectasis seen.   Electronically Signed   By: Garald Balding M.D. I personally reviewed the radiologic studies   ED Course:  Patient's stay here was uneventful and he had some pain relief with generalized pain medication which included Dilaudid 1 mg IV and Zofran 4 mg IV. Differential included aortic dissection, acute coronary ischemia, mainly with left sided abdominal pain diverticulitis, renal colic, gastritis, pancreatitis, etc. The patient's workup is relatively benign him not sure the exact cause of his discomfort. He does have a history of reflux and this may have been some gastritis especially with the pain radiating toward the epigastric and left upper quadrant region. The patient doesn't appear to have an abdominal aneurysm which was one of the wife's concerns and I don't see any evidence of aortic dissection on his chest x-ray. His pulses are symmetric and he had easy relief with pain medication.     Clinical Course     Assessment: * Acute unspecified left upper quadrant abdominal pain   Final Clinical Impression:   Final diagnoses:  Left upper quadrant pain     Plan:  Outpatient Prescription for Carafate Patient was advised to return immediately if condition worsens. Patient was advised to follow up with their primary care physician or other specialized physicians involved in their outpatient care. The patient and/or family member/power of attorney had laboratory results reviewed at the bedside. All questions and concerns were addressed and appropriate discharge instructions were distributed by the nursing staff.             Daymon Larsen, MD 03/04/16 [phone]

## 2016-03-04 NOTE — ED Notes (Signed)
Pt has finished po contrast, ct notified.  

## 2016-03-04 NOTE — Discharge Instructions (Signed)
Please return immediately if condition worsens. Please contact her primary physician or the physician you were given for referral. If you have any specialist physicians involved in her treatment and plan please also contact them. Thank you for using [place] [facility] emergency [facility]. ° °

## 2016-03-04 NOTE — ED Triage Notes (Signed)
Pt c/o constant upper abdominal and lower chest pain that radiates to his back since about 10pm last night; reports feeling short of breath, diaphoretic, nauseated but no vomiting; history of pacemaker; father with history of AAA

## 2016-03-04 NOTE — ED Notes (Signed)
Pt sipping on po contrast, pt placed on oxygen at 2lpm via [place]. Pt reports improved nausea and improved pain.

## 2017-01-11 IMAGING — CT CT ABD-PELV W/ CM
2 of 5 series · 17 of 46 positions shown, 19 images · IV contrast (APPLIED)
Comparison: Abdominal ultrasound performed 07/01/2013

CLINICAL DATA: Acute onset of upper abdominal and lower chest pain,
radiating to the back. Shortness of breath and diaphoresis. Nausea.
Initial encounter.

EXAM:
CT ABDOMEN AND PELVIS WITH CONTRAST
TECHNIQUE: Multidetector CT imaging of the abdomen and pelvis was performed
using the standard protocol following bolus administration of
intravenous contrast.
CONTRAST:  150mL A19Y63-OOO IOPAMIDOL (A19Y63-OOO) INJECTION 61%

[Series 5: coronal st · coronal · 1.14mm/px · 3 of 138 slices shown]
[im 46/138  soft-tissue]
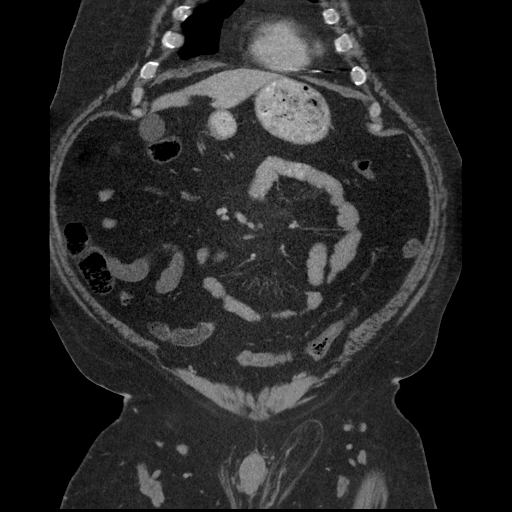
[im 61/138  soft-tissue]
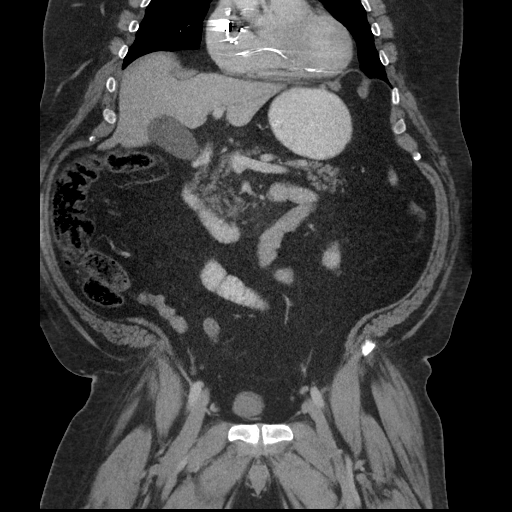
[im 77/138  soft-tissue]
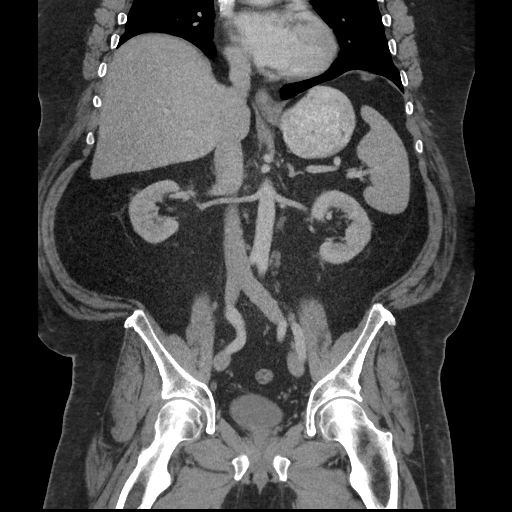

[Series 8: 150ml iso 300 · axial · 0.98mm/px · z∈[-900,-385]mm · 14 of 117 slices shown, 16 images]
[im 7/117  soft-tissue]
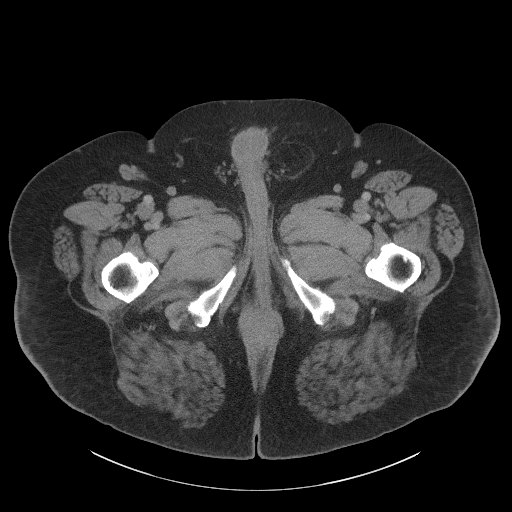
[im 7/117  bone]
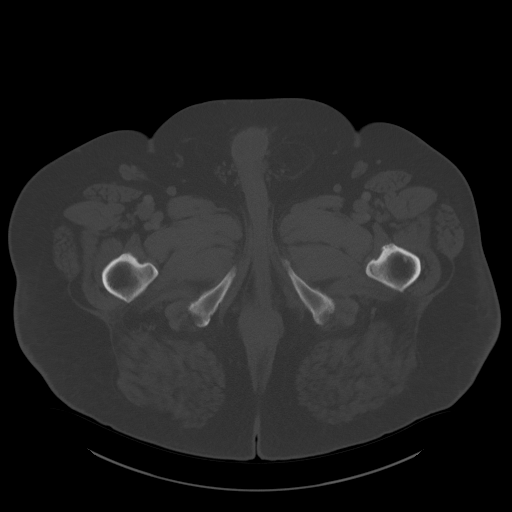
[im 13/117  soft-tissue]
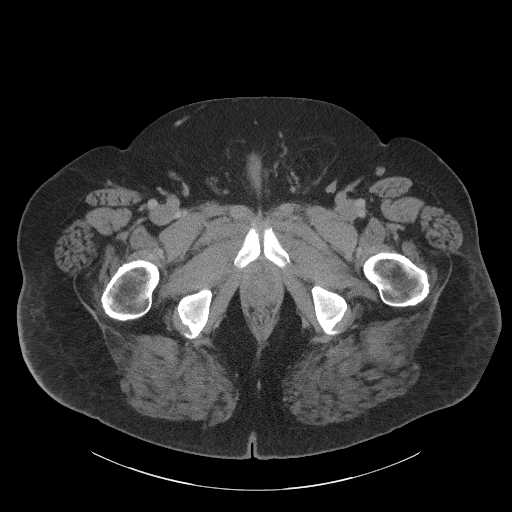
[im 25/117  soft-tissue]
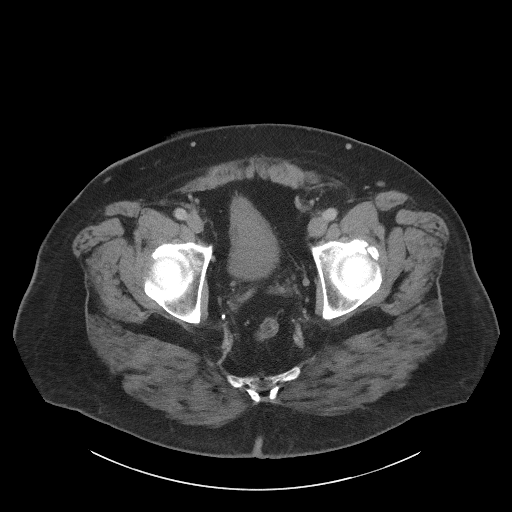
[im 31/117  soft-tissue]
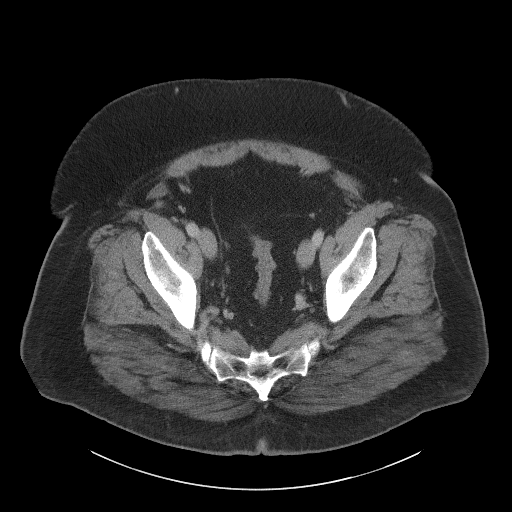
[im 37/117  soft-tissue]
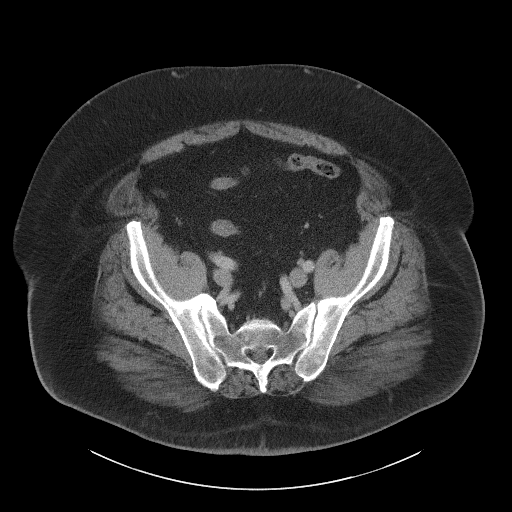
[im 49/117  soft-tissue]
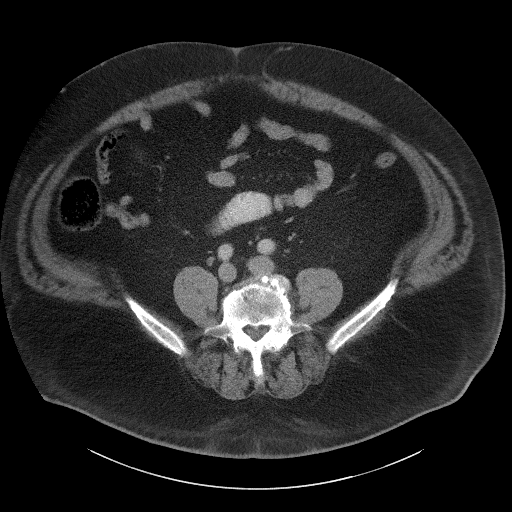
[im 55/117  soft-tissue]
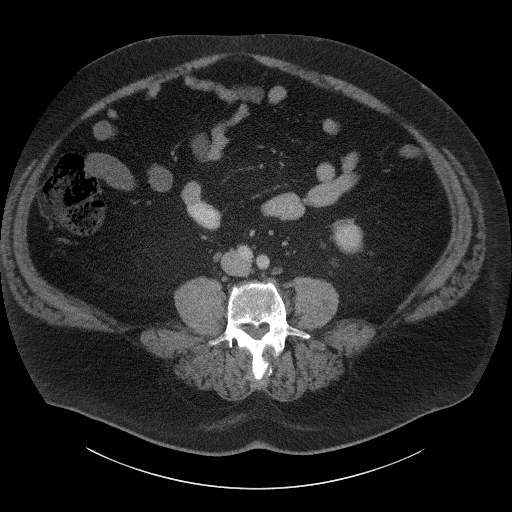
[im 62/117  soft-tissue]
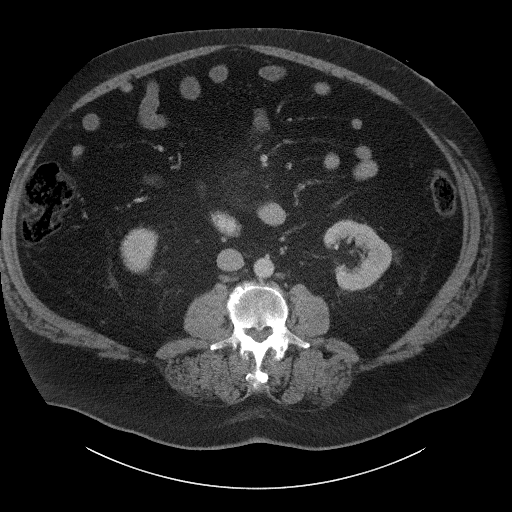
[im 68/117  soft-tissue]
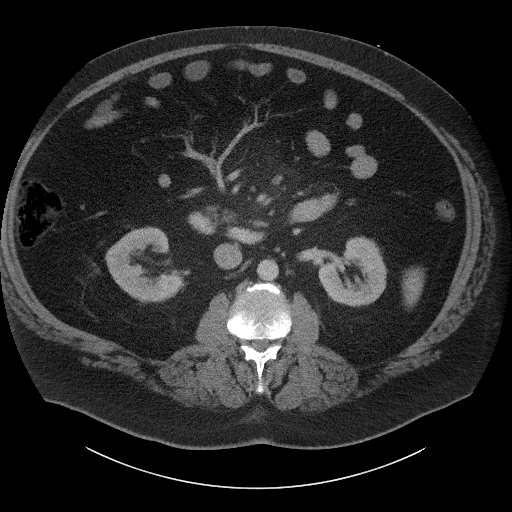
[im 68/117  bone]
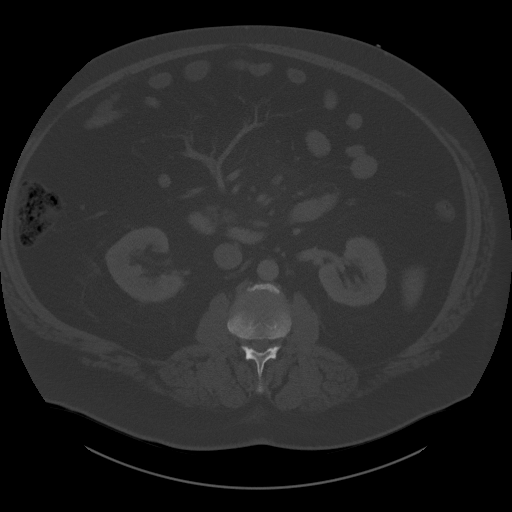
[im 80/117  soft-tissue]
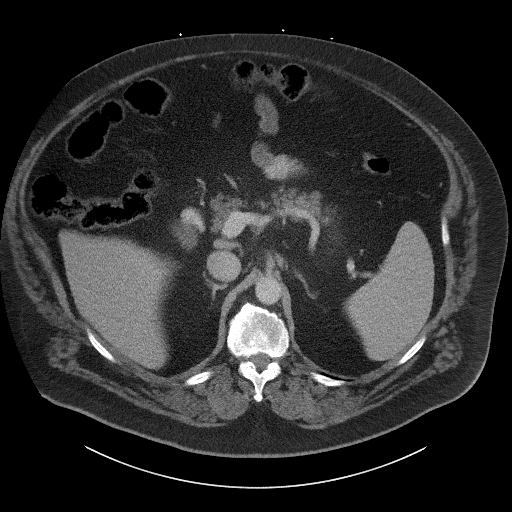
[im 86/117  soft-tissue]
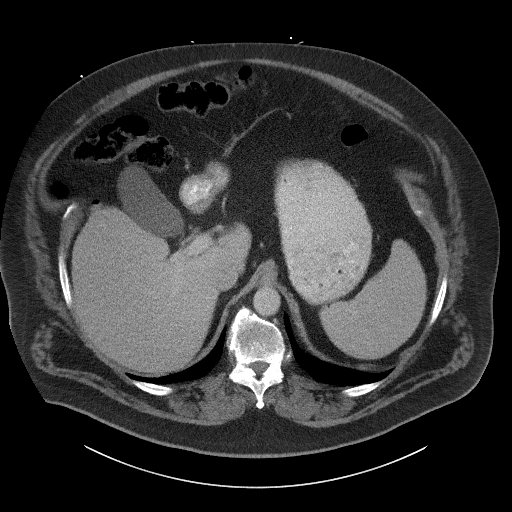
[im 92/117  soft-tissue]
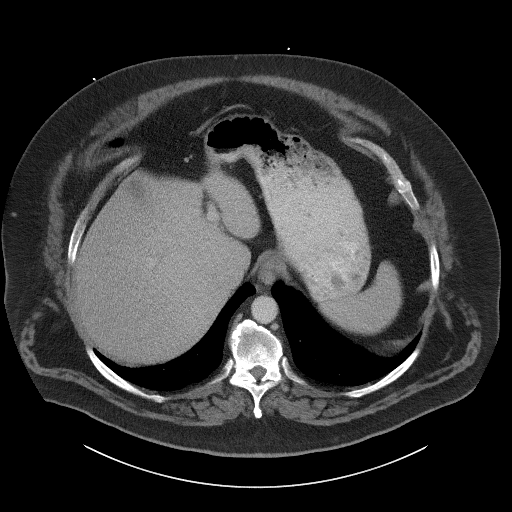
[im 104/117  soft-tissue]
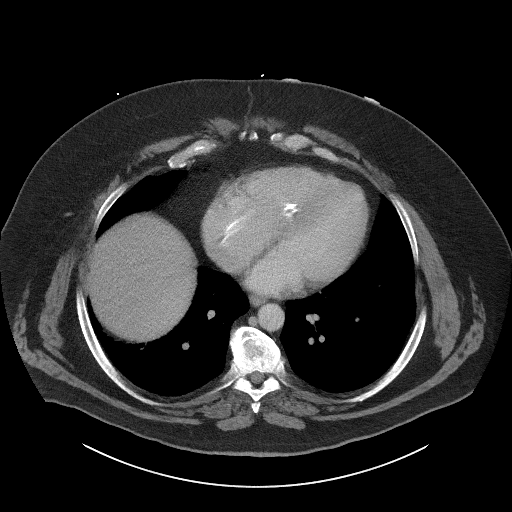
[im 110/117  soft-tissue]
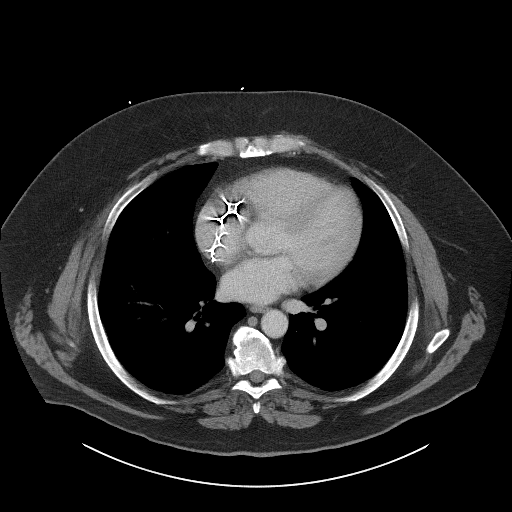

[17 of 46 positions shown; findings below may reference images not displayed]

FINDINGS: The visualized lung bases are clear.

A 2.4 cm cyst is noted at the anterior aspect of the left hepatic
lobe. The liver and spleen are otherwise unremarkable. A small stone
is seen within the gallbladder. The gallbladder is otherwise
unremarkable. The pancreas and adrenal glands are unremarkable.

Small bilateral renal cysts are noted. The kidneys are otherwise
unremarkable. There is no evidence of hydronephrosis. Nonspecific
perinephric stranding is noted bilaterally. No renal or ureteral
stones are seen.

No free fluid is identified. The small bowel is unremarkable in
appearance. The stomach is within normal limits. No acute vascular
abnormalities are seen.

The patient is status post appendectomy. The colon is largely
decompressed and not well assessed. There is no evidence of
diverticulosis.

The bladder is mildly distended and grossly unremarkable. The
prostate is normal in size. No inguinal lymphadenopathy is seen.

No acute osseous abnormalities are identified. Mild degenerative
change is noted along the lower lumbar spine.
IMPRESSION: 1. No acute abnormality seen within the abdomen or pelvis.
2. Small hepatic cyst and renal cysts.

## 2018-11-07 ENCOUNTER — Other Ambulatory Visit: Payer: Self-pay

## 2018-11-07 ENCOUNTER — Emergency Department
Admission: EM | Admit: 2018-11-07 | Discharge: 2018-11-07 | Disposition: A | Discharge status: Home / Self Care | Payer: Managed Care, Other (non HMO) | Attending: Student in an Organized Health Care Education/Training Program | Admitting: Student in an Organized Health Care Education/Training Program

## 2018-11-07 ENCOUNTER — Encounter: Payer: Self-pay | Admitting: Emergency Medicine

## 2018-11-07 ENCOUNTER — Emergency Department: Payer: Managed Care, Other (non HMO)

## 2018-11-07 DIAGNOSIS — W311XXA Contact with metalworking machines, initial encounter: Secondary | ICD-10-CM | POA: Diagnosis not present

## 2018-11-07 DIAGNOSIS — I10 Essential (primary) hypertension: Secondary | ICD-10-CM | POA: Diagnosis not present

## 2018-11-07 DIAGNOSIS — Z87891 Personal history of nicotine dependence: Secondary | ICD-10-CM | POA: Insufficient documentation

## 2018-11-07 DIAGNOSIS — J45909 Unspecified asthma, uncomplicated: Secondary | ICD-10-CM | POA: Insufficient documentation

## 2018-11-07 DIAGNOSIS — S6992XA Unspecified injury of left wrist, hand and finger(s), initial encounter: Secondary | ICD-10-CM

## 2018-11-07 DIAGNOSIS — Z79899 Other long term (current) drug therapy: Secondary | ICD-10-CM | POA: Insufficient documentation

## 2018-11-07 DIAGNOSIS — Y999 Unspecified external cause status: Secondary | ICD-10-CM | POA: Insufficient documentation

## 2018-11-07 DIAGNOSIS — Z950 Presence of cardiac pacemaker: Secondary | ICD-10-CM | POA: Insufficient documentation

## 2018-11-07 DIAGNOSIS — S61012A Laceration without foreign body of left thumb without damage to nail, initial encounter: Secondary | ICD-10-CM | POA: Diagnosis not present

## 2018-11-07 DIAGNOSIS — Z23 Encounter for immunization: Secondary | ICD-10-CM | POA: Diagnosis not present

## 2018-11-07 DIAGNOSIS — Y929 Unspecified place or not applicable: Secondary | ICD-10-CM | POA: Insufficient documentation

## 2018-11-07 DIAGNOSIS — Y9389 Activity, other specified: Secondary | ICD-10-CM | POA: Insufficient documentation

## 2018-11-07 MED ORDER — TETANUS-DIPHTH-ACELL PERTUSSIS 5-2.5-18.5 LF-MCG/0.5 IM SUSP
0.5000 mL | Freq: Once | INTRAMUSCULAR | Status: AC
  Administered 2018-11-07: 0.5 mL via INTRAMUSCULAR
  Filled 2018-11-07: qty 0.5

## 2018-11-07 MED ORDER — SULFAMETHOXAZOLE-TRIMETHOPRIM 800-160 MG PO TABS: 1.0000 | ORAL_TABLET | Freq: Two times a day (BID) | ORAL | 0 refills | Status: DC

## 2018-11-07 NOTE — ED Provider Notes (Signed)
[facility] Emergency Department Provider Note ____________________________________________  Time seen: 1500  I have reviewed the triage vital signs and the nursing notes.  HISTORY  Chief Complaint  Finger Injury   HPI Edwin Ruiz is a 63 y.o. male presents to the ER with c/o drilling through his left thumb with a drill bit. This occurred just PTA. He was able to control the bleeding at home. He reports he is able to bend and extend it normally. His pain is minimal. He washed the area with Peroxide and put a bandage on it. He has no idea when his last tetanus vaccine was.  Past Medical History:  Diagnosis Date  . Arthritis   . Asthma   . GERD (gastroesophageal reflux disease)   . Hematuria   . Hypertension   . Presence of permanent cardiac pacemaker   . Sleep apnea   . Urticaria     There are no active problems to display for this patient.   Past Surgical History:  Procedure Laterality Date  . APPENDECTOMY    . COLONOSCOPY WITH PROPOFOL N/A 02/05/2016   Procedure: COLONOSCOPY WITH PROPOFOL;  Surgeon: Lollie Sails, MD;  Location: [facility] ENDOSCOPY;  Service: Endoscopy;  Laterality: N/A;  . FOOT SURGERY    . INSERT / REPLACE / REMOVE PACEMAKER    . TONSILLECTOMY      Prior to Admission medications   Medication Sig Start Date End Date Taking? Authorizing Provider  acetaminophen (TYLENOL) 500 MG tablet Take 500 mg by mouth every 6 (six) hours as needed.    [provider]  etodolac (LODINE) 500 MG tablet Take 500 mg by mouth 2 (two) times daily.    [provider]  HYDROcodone-acetaminophen (NORCO/VICODIN) 5-325 MG tablet Take 1 tablet by mouth every 6 (six) hours as needed for moderate pain.    [provider]  losartan (COZAAR) 100 MG tablet Take 100 mg by mouth daily.    [provider]  omeprazole (PRILOSEC) 20 MG capsule Take 20 mg by mouth daily.    [provider]  sucralfate (CARAFATE) 1 g  tablet Take 1 tablet (1 g total) by mouth 4 (four) times daily. 03/04/16 03/04/17  Daymon Larsen, MD  sulfamethoxazole-trimethoprim (BACTRIM DS,SEPTRA DS) 800-160 MG tablet Take 1 tablet by mouth 2 (two) times daily. 11/07/18   Jearld Fenton, NP  tadalafil (CIALIS) 20 MG tablet Take 20 mg by mouth daily as needed for erectile dysfunction.    [provider]    Allergies Penicillins  No family history on file.  Social History Social History   Tobacco Use  . Smoking status: Former Research scientist (life sciences)  . Smokeless tobacco: Never Used  Substance Use Topics  . Alcohol use: Yes    Comment: occasional  . Drug use: No    Review of Systems  Constitutional: Negative for fever, chills or body aches. Cardiovascular: Negative for chest tightness or chest pain. Respiratory: Negative for cough or shortness of breath. Musculoskeletal: Positive for left thumb pain.  Skin: Positive for puncture wound of left thumb. Neurological: Negative for headaches, focal weakness, numbness or tingling. ____________________________________________  PHYSICAL EXAM:  VITAL SIGNS: ED Triage Vitals  Enc Vitals Group     BP 11/07/18 1451 (!) 184/76     Pulse Rate 11/07/18 1451 78     Resp 11/07/18 1451 18     Temp 11/07/18 1451 98.8 F (37.1 C)     Temp src --      SpO2  11/07/18 1451 98 %     Weight 11/07/18 1452 (!) 385 lb (174.6 kg)     Height 11/07/18 1452 6\' 4"  (1.93 m)     Head Circumference --      Peak Flow --      Pain Score 11/07/18 1452 4     Pain Loc --      Pain Edu? --      Excl. in [place]? --     Constitutional: Alert and oriented. Well appearing and in no distress. Cardiovascular: Normal rate, regular rhythm. Radial pulses 2+ bilaterally. Cap refill < 3 secs in left thumb. Respiratory: Normal respiratory effort. No wheezes/rales/rhonchi. Musculoskeletal: Normal flexion and extension of the thumb. Pain with palpation at the tip of left thumb. Normal resistance to flexion of the left thumb.  Hand grips equal. Neurologic:  Sensation intact to BUE. Skin:  Puncture wound noted just adjacent to the left medial nail bed. Second puncture wound noted of the left medial pad of finger. ____________________________________________   RADIOLOGY   Imaging Orders     DG Finger Thumb Left   ____________________________________________  PROCEDURES  .Marland KitchenLaceration Repair Date/Time: 11/07/2018 3:13 PM Performed by: Jearld Fenton, NP Authorized by: Jearld Fenton, NP   Consent:    Consent obtained:  Verbal   Consent given by:  Patient   Risks discussed:  Infection, retained foreign body and poor cosmetic result   Alternatives discussed:  No treatment Anesthesia (see MAR for exact dosages):    Anesthesia method:  None Laceration details:    Location:  Finger   Finger location:  L thumb   Length (cm):  0.3   Depth (mm):  4 Pre-procedure details:    Preparation:  Patient was prepped and draped in usual sterile fashion and imaging obtained to evaluate for foreign bodies Exploration:    Hemostasis achieved with:  Direct pressure   Wound exploration: wound explored through full range of motion     Contaminated: no   Treatment:    Area cleansed with:  Betadine and saline   Amount of cleaning:  Standard   Irrigation solution:  Sterile saline   Irrigation method:  Syringe   Visualized foreign bodies/material removed: no   Skin repair:    Repair method:  Tissue adhesive Approximation:    Approximation:  Close Post-procedure details:    Dressing:  Open (no dressing)   Patient tolerance of procedure:  Tolerated well, no immediate complications    ____________________________________________  INITIAL IMPRESSION / ASSESSMENT AND PLAN / ED COURSE  Puncture Wound of Left Thumb:  Xray left thumb negative Area cleansed with Betadine and Sterile Saline Punctured areas covered with surgical adhesive Tdap given in ER RX for Septra DS 1 tab PO BID  x 7 days for  prophylaxis    ____________________________________________  FINAL CLINICAL IMPRESSION(S) / ED DIAGNOSES  Final diagnoses:  Injury of finger of left hand, initial encounter   Webb Silversmith, NP    Jearld Fenton, NP 11/07/18 [place]    Merlyn Lot, MD 11/07/18 1718

## 2018-11-07 NOTE — Discharge Instructions (Addendum)
You were seen today for a puncture wound of your left thumb. Your xray is negative. You received a tetanus booster today. I have given you a RX for antibiotics to take 2 x day for the next 7 days to prevent infection. Monitor for increased pain, redness, warmth or purulent drainage. Follow up with PCP if needed.

## 2018-11-07 NOTE — ED Triage Notes (Signed)
Drilled into L thumb just prior to arrival.

## 2022-01-16 ENCOUNTER — Ambulatory Visit
Admission: RE | Admit: 2022-01-16 | Discharge: 2022-01-16 | Disposition: A | Discharge status: Home / Self Care | Payer: Medicare PPO | Attending: Cardiology | Admitting: Cardiology

## 2022-01-16 ENCOUNTER — Encounter
Admission: RE | Disposition: A | Discharge status: Home / Self Care | Payer: Self-pay | Source: Home / Self Care | Attending: Cardiology

## 2022-01-16 ENCOUNTER — Other Ambulatory Visit: Payer: Self-pay

## 2022-01-16 ENCOUNTER — Encounter: Payer: Self-pay | Admitting: Cardiology

## 2022-01-16 DIAGNOSIS — Z01818 Encounter for other preprocedural examination: Secondary | ICD-10-CM

## 2022-01-16 DIAGNOSIS — Z45010 Encounter for checking and testing of cardiac pacemaker pulse generator [battery]: Secondary | ICD-10-CM | POA: Diagnosis not present

## 2022-01-16 HISTORY — PX: PPM GENERATOR CHANGEOUT: EP1233

## 2022-01-16 SURGERY — PPM GENERATOR CHANGEOUT
Anesthesia: Moderate Sedation

## 2022-01-16 MED ORDER — LIDOCAINE HCL (PF) 1 % IJ SOLN
INTRAMUSCULAR | Status: DC | PRN
  Administered 2022-01-16: 20 mL

## 2022-01-16 MED ORDER — CHLORHEXIDINE GLUCONATE CLOTH 2 % EX PADS
6.0000 | MEDICATED_PAD | Freq: Every day | CUTANEOUS | Status: DC
  Administered 2022-01-16: 6 via TOPICAL

## 2022-01-16 MED ORDER — VANCOMYCIN HCL IN DEXTROSE 1-5 GM/200ML-% IV SOLN
1000.0000 mg | INTRAVENOUS | Status: AC
  Administered 2022-01-16: 1000 mg via INTRAVENOUS
  Filled 2022-01-16: qty 200

## 2022-01-16 MED ORDER — SODIUM CHLORIDE 0.9 % IV SOLN
80.0000 mg | INTRAVENOUS | Status: AC
  Administered 2022-01-16: 80 mg
  Filled 2022-01-16: qty 80

## 2022-01-16 MED ORDER — CLARITHROMYCIN ER 500 MG PO TB24: 1000.0000 mg | ORAL_TABLET | Freq: Every day | ORAL | 0 refills | Status: AC

## 2022-01-16 MED ORDER — LIDOCAINE HCL 1 % IJ SOLN
INTRAMUSCULAR | Status: AC
  Filled 2022-01-16: qty 40

## 2022-01-16 MED ORDER — MIDAZOLAM HCL 2 MG/2ML IJ SOLN
INTRAMUSCULAR | Status: DC | PRN
  Administered 2022-01-16: 1 mg via INTRAVENOUS

## 2022-01-16 MED ORDER — SODIUM CHLORIDE 0.9 % IV SOLN: INTRAVENOUS | Status: DC

## 2022-01-16 MED ORDER — ACETAMINOPHEN 325 MG PO TABS: 325.0000 mg | ORAL_TABLET | ORAL | Status: DC | PRN

## 2022-01-16 MED ORDER — FENTANYL CITRATE (PF) 100 MCG/2ML IJ SOLN
INTRAMUSCULAR | Status: AC
  Filled 2022-01-16: qty 2

## 2022-01-16 MED ORDER — LIDOCAINE HCL 1 % IJ SOLN
INTRAMUSCULAR | Status: AC
  Filled 2022-01-16: qty 20

## 2022-01-16 MED ORDER — HEPARIN (PORCINE) IN NACL 1000-0.9 UT/500ML-% IV SOLN
INTRAVENOUS | Status: AC
  Filled 2022-01-16: qty 1000

## 2022-01-16 MED ORDER — ONDANSETRON HCL 4 MG/2ML IJ SOLN: 4.0000 mg | Freq: Four times a day (QID) | INTRAMUSCULAR | Status: DC | PRN

## 2022-01-16 MED ORDER — MIDAZOLAM HCL 2 MG/2ML IJ SOLN
INTRAMUSCULAR | Status: AC
  Filled 2022-01-16: qty 2

## 2022-01-16 MED ORDER — FENTANYL CITRATE (PF) 100 MCG/2ML IJ SOLN
INTRAMUSCULAR | Status: DC | PRN
  Administered 2022-01-16: 50 ug via INTRAVENOUS

## 2022-01-16 SURGICAL SUPPLY — 20 items
CABLE SURG 12 DISP A/V CHANNEL (MISCELLANEOUS) ×2 IMPLANT
COVER SURGICAL LIGHT HANDLE (MISCELLANEOUS) ×1 IMPLANT
DEVICE DSSCT PLSMBLD 3.0S LGHT (MISCELLANEOUS) IMPLANT
DRAPE INCISE 23X17 IOBAN STRL (DRAPES) ×1
DRAPE INCISE 23X17 STRL (DRAPES) IMPLANT
DRAPE INCISE IOBAN 23X17 STRL (DRAPES) ×1 IMPLANT
IPG PACE AZUR XT DR MRI W1DR01 (Pacemaker) IMPLANT
KIT WRENCH (KITS) ×1 IMPLANT
PACE AZURE XT DR MRI W1DR01 (Pacemaker) ×2 IMPLANT
PAD ELECT DEFIB RADIOL ZOLL (MISCELLANEOUS) ×2 IMPLANT
PLASMABLADE 3.0S W/LIGHT (MISCELLANEOUS) ×2
POUCH AIGIS-R ANTIBACT PPM (Mesh General) ×2 IMPLANT
POUCH AIGIS-R ANTIBACT PPM MED (Mesh General) IMPLANT
SPONGE XRAY 4X4 16PLY STRL (MISCELLANEOUS) ×2 IMPLANT
SUT VIC AB 2-0 CT1 27 (SUTURE) ×1
SUT VIC AB 2-0 CT1 TAPERPNT 27 (SUTURE) IMPLANT
SUT VICRYL 4-0  27 PS-2 BARIAT (SUTURE) ×1
SUT VICRYL 4-0 27 PS-2 BARIAT (SUTURE) ×1
SUTURE VICRYL 4-0 27 PS-2 BART (SUTURE) IMPLANT
TRAY PACEMAKER INSERTION (PACKS) ×2 IMPLANT

## 2022-01-16 NOTE — Discharge Instructions (Signed)
Patient may shower on 01/19/2022.  Leave Steri-Strips on.

## 2022-03-12 ENCOUNTER — Emergency Department: Payer: Medicare PPO

## 2022-03-12 ENCOUNTER — Encounter: Payer: Self-pay | Admitting: Emergency Medicine

## 2022-03-12 ENCOUNTER — Other Ambulatory Visit: Payer: Self-pay

## 2022-03-12 DIAGNOSIS — Z950 Presence of cardiac pacemaker: Secondary | ICD-10-CM | POA: Diagnosis not present

## 2022-03-12 DIAGNOSIS — R1012 Left upper quadrant pain: Secondary | ICD-10-CM | POA: Diagnosis present

## 2022-03-12 DIAGNOSIS — Z79899 Other long term (current) drug therapy: Secondary | ICD-10-CM | POA: Insufficient documentation

## 2022-03-12 DIAGNOSIS — R110 Nausea: Secondary | ICD-10-CM | POA: Insufficient documentation

## 2022-03-12 DIAGNOSIS — I10 Essential (primary) hypertension: Secondary | ICD-10-CM | POA: Diagnosis not present

## 2022-03-12 DIAGNOSIS — J45909 Unspecified asthma, uncomplicated: Secondary | ICD-10-CM | POA: Insufficient documentation

## 2022-03-12 LAB — COMPREHENSIVE METABOLIC PANEL
ALT: 11 U/L (ref 0–44)
AST: 17 U/L (ref 15–41)
Albumin: 3.9 g/dL (ref 3.5–5.0)
Alkaline Phosphatase: 66 U/L (ref 38–126)
Anion gap: 6 (ref 5–15)
BUN: 20 mg/dL (ref 8–23)
CO2: 30 mmol/L (ref 22–32)
Calcium: 8.8 mg/dL — ABNORMAL LOW (ref 8.9–10.3)
Chloride: 103 mmol/L (ref 98–111)
Creatinine, Ser: 1.21 mg/dL (ref 0.61–1.24)
GFR, Estimated: >60 mL/min (ref >60)
Glucose, Bld: 146 mg/dL — ABNORMAL HIGH (ref 70–99)
Potassium: 4.1 mmol/L (ref 3.5–5.1)
Sodium: 139 mmol/L (ref 135–145)
Total Bilirubin: 0.8 mg/dL (ref 0.3–1.2)
Total Protein: 7 g/dL (ref 6.5–8.1)

## 2022-03-12 LAB — CBC
HCT: 38.6 % — ABNORMAL LOW (ref 39.0–52.0)
Hemoglobin: 13.2 g/dL (ref 13.0–17.0)
MCH: 32.3 pg (ref 26.0–34.0)
MCHC: 34.2 g/dL (ref 30.0–36.0)
MCV: 94.4 fL (ref 80.0–100.0)
Platelets: 177 10*3/uL (ref 150–400)
RBC: 4.09 MIL/uL — ABNORMAL LOW (ref 4.22–5.81)
RDW: 13.7 % (ref 11.5–15.5)
WBC: 6.6 10*3/uL (ref 4.0–10.5)
nRBC: 0 % (ref 0.0–0.2)

## 2022-03-12 LAB — TROPONIN I (HIGH SENSITIVITY): Troponin I (High Sensitivity): 4 ng/L (ref <18)

## 2022-03-12 LAB — LIPASE, BLOOD: Lipase: 33 U/L (ref 11–51)

## 2022-03-12 NOTE — ED Triage Notes (Signed)
Pt to ED from home c/o left side pain radiating up back and around to abd and chest, denies SOB or cough.  Denies n/v/d.  Pt has pacemaker that was replaced 01/17/22, medtronic brand.  Pt states has been sweating, appears uncomfortable in triage, chest rise even and unlabored, face pale.

## 2022-03-13 ENCOUNTER — Emergency Department: Payer: Medicare PPO

## 2022-03-13 ENCOUNTER — Emergency Department
Admission: EM | Admit: 2022-03-13 | Discharge: 2022-03-13 | Disposition: A | Discharge status: Home / Self Care | Payer: Medicare PPO | Attending: Emergency Medicine | Admitting: Emergency Medicine

## 2022-03-13 DIAGNOSIS — I10 Essential (primary) hypertension: Secondary | ICD-10-CM

## 2022-03-13 DIAGNOSIS — K8020 Calculus of gallbladder without cholecystitis without obstruction: Secondary | ICD-10-CM

## 2022-03-13 DIAGNOSIS — R1010 Upper abdominal pain, unspecified: Secondary | ICD-10-CM

## 2022-03-13 DIAGNOSIS — R911 Solitary pulmonary nodule: Secondary | ICD-10-CM

## 2022-03-13 DIAGNOSIS — R0789 Other chest pain: Secondary | ICD-10-CM

## 2022-03-13 LAB — URINALYSIS, ROUTINE W REFLEX MICROSCOPIC
Bacteria, UA: NONE SEEN
Bilirubin Urine: NEGATIVE
Glucose, UA: NEGATIVE mg/dL
Hgb urine dipstick: NEGATIVE
Ketones, ur: NEGATIVE mg/dL
Leukocytes,Ua: NEGATIVE
Nitrite: NEGATIVE
Protein, ur: 30 mg/dL — AB
Specific Gravity, Urine: 1.028 (ref 1.005–1.030)
pH: 5 (ref 5.0–8.0)

## 2022-03-13 LAB — TROPONIN I (HIGH SENSITIVITY): Troponin I (High Sensitivity): 4 ng/L (ref <18)

## 2022-03-13 MED ORDER — MORPHINE SULFATE (PF) 4 MG/ML IV SOLN
4.0000 mg | Freq: Once | INTRAVENOUS | Status: AC
  Administered 2022-03-13: 4 mg via INTRAVENOUS
  Filled 2022-03-13: qty 1

## 2022-03-13 MED ORDER — ONDANSETRON HCL 4 MG/2ML IJ SOLN
4.0000 mg | Freq: Once | INTRAMUSCULAR | Status: AC
Start: 2022-03-13 — End: 2022-03-13
  Administered 2022-03-13: 4 mg via INTRAVENOUS
  Filled 2022-03-13: qty 2

## 2022-03-13 MED ORDER — IOHEXOL 350 MG/ML SOLN
100.0000 mL | Freq: Once | INTRAVENOUS | Status: AC | PRN
  Administered 2022-03-13: 100 mL via INTRAVENOUS

## 2022-03-13 NOTE — ED Provider Notes (Signed)
This patient in signout pending imaging. Findings communicated to patient including thoracic aortic aneurysm, pulmonary nodule, cholelithiasis without signs of cholecystitis. Patient feels well now, no symptoms, denies pain. Abdomen is soft nontender on my reexamination at this time. Patient agrees with discharge home and follow-up referrals given to general surgery, cardiothoracic surgery, pulmonary nodule clinic.  Return precautions given.  Lucillie Garfinkel MD 9:38 AM    Lucillie Garfinkel, MD 03/13/22 [phone]

## 2022-03-13 NOTE — ED Provider Notes (Signed)
[facility] Provider Note    Event Date/Time   First MD Initiated Contact with Patient 03/13/22 [phone]     (approximate)   History   Back Pain and Chest Pain   HPI  Edwin Ruiz is a 66 y.o. male with history of hypertension, hyperlipidemia, obesity, second-degree AV block with dual-chamber pacemaker that was changed on 01/16/2022 who presents to the emergency department with his wife for complaints of diffuse upper abdominal pain worse in the left upper quadrant that radiates into the left flank.  He did have some diffuse chest pain.  No shortness of breath, fever.  Has had nausea without vomiting.  No diarrhea.  Has had previous appendectomy.  States symptoms started after eating [place] for dinner.  He is still uncomfortable but reports he is feeling better than he did previously.  Wife reports patient's brother had an aortic aneurysm.   History provided by patient and wife.    Past Medical History:  Diagnosis Date   Arthritis    Asthma    GERD (gastroesophageal reflux disease)    Hematuria    Hypertension    Presence of permanent cardiac pacemaker    Sleep apnea    Urticaria     Past Surgical History:  Procedure Laterality Date   APPENDECTOMY     COLONOSCOPY WITH PROPOFOL N/A 02/05/2016   Procedure: COLONOSCOPY WITH PROPOFOL;  Surgeon: Lollie Sails, MD;  Location: [facility] ENDOSCOPY;  Service: Endoscopy;  Laterality: N/A;   FOOT SURGERY     INSERT / REPLACE / REMOVE PACEMAKER     PPM GENERATOR CHANGEOUT N/A 01/16/2022   Procedure: PPM GENERATOR CHANGEOUT;  Surgeon: Isaias Cowman, MD;  Location: [place] CV LAB;  Service: Cardiovascular;  Laterality: N/A;   TONSILLECTOMY      MEDICATIONS:  Prior to Admission medications   Medication Sig Start Date End Date Taking? Authorizing Provider  acetaminophen (TYLENOL) 500 MG tablet Take 500 mg by mouth every 6 (six) hours as needed.    [provider]  losartan (COZAAR) 100 MG  tablet Take 100 mg by mouth daily.    [provider]  meloxicam (MOBIC) 15 MG tablet Take 15 mg by mouth daily.    [provider]  omeprazole (PRILOSEC) 20 MG capsule Take 20 mg by mouth daily.    [provider]  tadalafil (CIALIS) 20 MG tablet Take 20 mg by mouth daily as needed for erectile dysfunction.    [provider]    Physical Exam   Triage Vital Signs: ED Triage Vitals  Enc Vitals Group     BP 03/12/22 2205 (!) 148/76     Pulse Rate 03/12/22 2205 79     Resp 03/12/22 2205 18     Temp 03/12/22 2205 98.7 F (37.1 C)     Temp Source 03/13/22 0100 Oral     SpO2 03/12/22 2205 96 %     Weight 03/12/22 2217 (!) 390 lb (176.9 kg)     Height 03/12/22 2217 '6\' 3"'$  (1.905 m)     Head Circumference --      Peak Flow --      Pain Score 03/12/22 2217 8     Pain Loc --      Pain Edu? --      Excl. in [place]? --     Most recent vital signs: Vitals:   03/13/22 0530 03/13/22 0553  BP: (!) 161/85   Pulse: 67   Resp: (!) 22  Temp:  98.3 F (36.8 C)  SpO2: 98%     CONSTITUTIONAL: Alert and oriented and responds appropriately to questions. Well-appearing; well-nourished HEAD: Normocephalic, atraumatic EYES: Conjunctivae clear, pupils appear equal, sclera nonicteric ENT: normal nose; moist mucous membranes NECK: Supple, normal ROM CARD: RRR; S1 and S2 appreciated; no murmurs, no clicks, no rubs, no gallops RESP: Normal chest excursion without splinting or tachypnea; breath sounds clear and equal bilaterally; no wheezes, no rhonchi, no rales, no hypoxia or respiratory distress, speaking full sentences ABD/GI: Normal bowel sounds; non-distended; soft, non-tender, no rebound, no guarding, no peritoneal signs, exam limited due to body habitus BACK: The back appears normal EXT: Normal ROM in all joints; no deformity noted, no cyanosis, no calf tenderness SKIN: Normal color for age and race; warm; no rash on exposed skin NEURO: Moves all extremities  equally, normal speech PSYCH: The patient's mood and manner are appropriate.   ED Results / Procedures / Treatments   LABS: (all labs ordered are listed, but only abnormal results are displayed) Labs Reviewed  CBC - Abnormal; Notable for the following components:      Result Value   RBC 4.09 (*)    HCT 38.6 (*)    All other components within normal limits  COMPREHENSIVE METABOLIC PANEL - Abnormal; Notable for the following components:   Glucose, Bld 146 (*)    Calcium 8.8 (*)    All other components within normal limits  URINALYSIS, ROUTINE W REFLEX MICROSCOPIC - Abnormal; Notable for the following components:   Color, Urine YELLOW (*)    APPearance CLEAR (*)    Protein, ur 30 (*)    All other components within normal limits  LIPASE, BLOOD  TROPONIN I (HIGH SENSITIVITY)  TROPONIN I (HIGH SENSITIVITY)     EKG:  EKG Interpretation  Date/Time:  Tuesday March 12 2022 22:03:08 EDT Ventricular Rate:  66 PR Interval:  178 QRS Duration: 190 QT Interval:  494 QTC Calculation: 517 R Axis:   260 Text Interpretation: AV dual-paced rhythm Abnormal ECG When compared with ECG of 04-Mar-2016 01:57, PREVIOUS ECG IS PRESENT Confirmed by Pryor Curia [phone]) on 03/13/2022 4:56:22 AM         RADIOLOGY: My personal review and interpretation of imaging: Chest x-ray clear.  I have personally reviewed all radiology reports.   DG Chest 2 View  Result Date: 03/12/2022 CLINICAL DATA:  Chest pain. EXAM: CHEST - 2 VIEW COMPARISON:  March 04, 2016 FINDINGS: There is a dual lead AICD. The heart size and mediastinal contours are within normal limits. Both lungs are clear. The visualized skeletal structures are unremarkable. IMPRESSION: No active cardiopulmonary disease. Electronically Signed   By: Virgina Norfolk M.D.   On: 03/12/2022 22:47     PROCEDURES:  Critical Care performed: No     .1-3 Lead EKG Interpretation  Performed by: Hawke Villalpando, Delice Bison, DO Authorized by: Jakerria Kingbird, Delice Bison, DO     Interpretation: normal     ECG rate:  75   ECG rate assessment: normal     Rhythm: sinus rhythm     Ectopy: none     Conduction: normal       IMPRESSION / MDM / ASSESSMENT AND PLAN / ED COURSE  I reviewed the triage vital signs and the nursing notes.    Patient here with chest pain, abdominal pain.  The patient is on the cardiac monitor to evaluate for evidence of arrhythmia and/or significant heart rate changes.   DIFFERENTIAL DIAGNOSIS (includes but not  limited to):   Cholelithiasis, cholecystitis, cholangitis, choledocholithiasis, pancreatitis, gastritis, GERD, ACS, CHF, pneumonia, PE, aortic aneurysm, dissection   Patient's presentation is most consistent with acute presentation with potential threat to life or bodily function.   PLAN: CBC, CMP, lipase, troponin x2, chest x-ray obtained from triage.  EKG shows paced rhythm.  Labs show no leukocytosis.  Normal hemoglobin.  Normal electrolytes, LFTs and lipase.  Troponin x2 negative.  Chest x-ray reviewed and interpreted by myself and the radiologist and shows no acute abnormality.  Will obtain CTA of the chest, abdomen pelvis as wife seems mostly concerned about aneurysm, dissection given his family history and that he is hypertensive here.  I think this is very reasonable but I suspect that there is probably a component of biliary colic and/or GERD/gastritis causing symptoms today.  We will start with CT imaging but if this is unremarkable, patient may need a right upper quadrant ultrasound.  We will keep him n.p.o.  We will give pain and nausea medicine.  We will interrogate his pacemaker.   MEDICATIONS GIVEN IN ED: Medications  morphine (PF) 4 MG/ML injection 4 mg (4 mg Intravenous Given 03/13/22 0529)  ondansetron (ZOFRAN) injection 4 mg (4 mg Intravenous Given 03/13/22 0529)  iohexol (OMNIPAQUE) 350 MG/ML injection 100 mL (100 mLs Intravenous Contrast Given 03/13/22 0655)     ED COURSE: CT of the chest, abdomen  pelvis is still pending and signed out the oncoming ED physician at 7 AM.  As I discussed with patient, if CT scan is unrevealing, will consider right upper quadrant ultrasound as I suspect biliary colic may be playing a role.   CONSULTS: Dispo pending further work-up.   OUTSIDE RECORDS REVIEWED: Reviewed patient's last cardiology note in June 2023.       FINAL CLINICAL IMPRESSION(S) / ED DIAGNOSES   Final diagnoses:  Upper abdominal pain  Atypical chest pain  Hypertension, unspecified type     Rx / DC Orders   ED Discharge Orders     None        Note:  This document was prepared using Dragon voice recognition software and may include unintentional dictation errors.   Damya Comley, Delice Bison, DO 03/13/22 [phone]

## 2022-03-13 NOTE — ED Notes (Signed)
All follow up info provided to patient general surg, cardio and pulm nodlule clinic

## 2022-03-13 NOTE — Discharge Instructions (Addendum)
Talk to your doctor about incidental findings of thoracic aneurysm and lung nodules and follow up imaging as neccessary.   Call Dr. Christian Mate for follow up of your gallstone and discuss management options.  Call cardiothoracic surgery for follow up for thoracic aneurysm.  You were referred to lung nodule clinic for follow up.

## 2022-03-13 NOTE — ED Notes (Signed)
[employer] pacemaker interrogated. Data sent

## 2022-03-19 ENCOUNTER — Telehealth: Payer: Self-pay | Admitting: Surgery

## 2022-03-19 ENCOUNTER — Ambulatory Visit: Payer: Self-pay | Admitting: Surgery

## 2022-03-19 ENCOUNTER — Ambulatory Visit: Payer: Medicare PPO | Admitting: Surgery

## 2022-03-19 ENCOUNTER — Encounter: Payer: Self-pay | Admitting: Surgery

## 2022-03-19 VITALS — BP 134/79 | HR 89 | Temp 98.3°F | Ht 76.0 in | Wt 380.0 lb

## 2022-03-19 DIAGNOSIS — R911 Solitary pulmonary nodule: Secondary | ICD-10-CM | POA: Diagnosis not present

## 2022-03-19 DIAGNOSIS — K8010 Calculus of gallbladder with chronic cholecystitis without obstruction: Secondary | ICD-10-CM | POA: Insufficient documentation

## 2022-03-19 DIAGNOSIS — I7121 Aneurysm of the ascending aorta, without rupture: Secondary | ICD-10-CM | POA: Insufficient documentation

## 2022-03-19 NOTE — Patient Instructions (Signed)
Our surgery scheduler Barbara will call you within 24-48 hours to get you scheduled. If you have not heard from her after 48 hours, please call our office. Have the blue sheet available when she calls to write down important information.   If you have any concerns or questions, please feel free to call our office.    Minimally Invasive Cholecystectomy  Minimally invasive cholecystectomy is surgery to remove the gallbladder. The gallbladder is a pear-shaped organ that lies beneath the liver on the right side of the body. The gallbladder stores bile, which is a fluid that helps the body digest fats. Cholecystectomy is often done to treat inflammation (irritation and swelling) of the gallbladder (cholecystitis). This condition is usually caused by a buildup of gallstones (cholelithiasis) in the gallbladder or when the fluid in the gall bladder becomes stagnant because gallstones get stuck in the ducts (tubes) and block the flow of bile. This can result in inflammation and pain. In severe cases, emergency surgery may be required. This procedure is done through small incisions in the abdomen, instead of one large incision. It is also called laparoscopic surgery. A thin scope with a camera (laparoscope) is inserted through one incision. Then surgical instruments are inserted through the other incisions. In some cases, a minimally invasive surgery may need to be changed to a surgery that is done through a larger incision. This is called open surgery. Tell a health care provider about: Any allergies you have. All medicines you are taking, including vitamins, herbs, eye drops, creams, and over-the-counter medicines. Any problems you or family members have had with anesthetic medicines. Any bleeding problems you have. Any surgeries you have had. Any medical conditions you have. Whether you are pregnant or may be pregnant. What are the risks? Generally, this is a safe procedure. However, problems may occur,  including: Infection. Bleeding. Allergic reactions to medicines. Damage to nearby structures or organs. A gallstone remaining in the common bile duct. The common bile duct carries bile from the gallbladder to the small intestine. A bile leak from the liver or cystic duct after your gallbladder is removed. What happens before the procedure? When to stop eating and drinking Follow instructions from your health care provider about what you may eat and drink before your procedure. These may include: 8 hours before the procedure Stop eating most foods. Do not eat meat, fried foods, or fatty foods. Eat only light foods, such as toast or crackers. All liquids are okay except energy drinks and alcohol. 6 hours before the procedure Stop eating. Drink only clear liquids, such as water, clear fruit juice, black coffee, plain tea, and sports drinks. Do not drink energy drinks or alcohol. 2 hours before the procedure Stop drinking all liquids. You may be allowed to take medicines with small sips of water. If you do not follow your health care provider's instructions, your procedure may be delayed or canceled. Medicines Ask your health care provider about: Changing or stopping your regular medicines. This is especially important if you are taking diabetes medicines or blood thinners. Taking medicines such as aspirin and ibuprofen. These medicines can thin your blood. Do not take these medicines unless your health care provider tells you to take them. Taking over-the-counter medicines, vitamins, herbs, and supplements. General instructions If you will be going home right after the procedure, plan to have a responsible adult: Take you home from the hospital or clinic. You will not be allowed to drive. Care for you for the time you are   told. Do not use any products that contain nicotine or tobacco for at least 4 weeks before the procedure. These products include cigarettes, chewing tobacco, and vaping  devices, such as e-cigarettes. If you need help quitting, ask your health care provider. Ask your health care provider: How your surgery site will be marked. What steps will be taken to help prevent infection. These may include: Removing hair at the surgery site. Washing skin with a germ-killing soap. Taking antibiotic medicine. What happens during the procedure?  An IV will be inserted into one of your veins. You will be given one or both of the following: A medicine to help you relax (sedative). A medicine to make you fall asleep (general anesthetic). Your surgeon will make several small incisions in your abdomen. The laparoscope will be inserted through one of the small incisions. The camera on the laparoscope will send images to a monitor in the operating room. This lets your surgeon see inside your abdomen. A gas will be pumped into your abdomen. This will expand your abdomen to give the surgeon more room to perform the surgery. Other tools that are needed for the procedure will be inserted through the other incisions. The gallbladder will be removed through one of the incisions. Your common bile duct may be examined. If stones are found in the common bile duct, they may be removed. After your gallbladder has been removed, the incisions will be closed with stitches (sutures), staples, or skin glue. Your incisions will be covered with a bandage (dressing). The procedure may vary among health care providers and hospitals. What happens after the procedure? Your blood pressure, heart rate, breathing rate, and blood oxygen level will be monitored until you leave the hospital or clinic. You will be given medicines as needed to control your pain. You may have a drain placed in the incision. The drain will be removed a day or two after the procedure. Summary Minimally invasive cholecystectomy, also called laparoscopic cholecystectomy, is surgery to remove the gallbladder using small  incisions. Tell your health care provider about all the medical conditions you have and all the medicines you are taking for those conditions. Before the procedure, follow instructions about when to stop eating and drinking and changing or stopping medicines. Plan to have a responsible adult care for you for the time you are told after you leave the hospital or clinic. This information is not intended to replace advice given to you by your health care provider. Make sure you discuss any questions you have with your health care provider. Document Revised: 01/16/2021 Document Reviewed: 01/16/2021 Elsevier Patient Education  2023 [employer] Inc.  

## 2022-03-19 NOTE — Progress Notes (Signed)
Patient ID: Edwin Ruiz, male   DOB: [birth year], 66 y.o.   MRN: [id number]  Chief Complaint: Upper abdominal pain  History of Present Illness Edwin Ruiz is a 66 y.o. male with an ED visit approximate 1 week ago for cute onset of ill-defined upper abdominal pain which seems to be more left upper quadrant.  It progressed to a bandlike distribution.  The pain radiated to his back, had associated nausea without vomiting fevers or chills.  Reports normal bowel activity.  He was subsequently worked up in the ED with a CT scan and eventual ultrasound.  His pain improved with pain medications and the duration.  He reports this began after a [place] dinner.  He has been careful with his diet since his ED evaluation.  Suspects he may have had subtle forms of postprandial pain prior to this event.  Past Medical History Past Medical History:  Diagnosis Date   Arthritis    Asthma    GERD (gastroesophageal reflux disease)    Hematuria    Hypertension    Presence of permanent cardiac pacemaker    Sleep apnea    Urticaria       Past Surgical History:  Procedure Laterality Date   APPENDECTOMY     COLONOSCOPY WITH PROPOFOL N/A 02/05/2016   Procedure: COLONOSCOPY WITH PROPOFOL;  Surgeon: Lollie Sails, MD;  Location: [facility] ENDOSCOPY;  Service: Endoscopy;  Laterality: N/A;   FOOT SURGERY     INSERT / REPLACE / REMOVE PACEMAKER     PPM GENERATOR CHANGEOUT N/A 01/16/2022   Procedure: PPM GENERATOR CHANGEOUT;  Surgeon: Isaias Cowman, MD;  Location: [facility];  Service: Cardiovascular;  Laterality: N/A;   TONSILLECTOMY      Allergies  Allergen Reactions   Penicillins     Current Outpatient Medications  Medication Sig Dispense Refill   acetaminophen (TYLENOL) 500 MG tablet Take 500 mg by mouth every 6 (six) hours as needed.     losartan (COZAAR) 100 MG tablet Take 100 mg by mouth daily.     meloxicam (MOBIC) 15 MG tablet Take 15 mg by mouth daily.     omeprazole (PRILOSEC)  20 MG capsule Take 20 mg by mouth daily.     tadalafil (CIALIS) 20 MG tablet Take 20 mg by mouth daily as needed for erectile dysfunction.     No current facility-administered medications for this visit.    Family History History reviewed. No pertinent family history.    Social History Social History   Tobacco Use   Smoking status: Former   Smokeless tobacco: Never  Substance Use Topics   Alcohol use: Yes    Comment: occasional   Drug use: No        Review of Systems  Constitutional:  Positive for diaphoresis and fever.  HENT:  Positive for tinnitus.   Eyes: Negative.   Respiratory:  Positive for shortness of breath.   Cardiovascular:  Positive for leg swelling.  Gastrointestinal:  Positive for abdominal pain and nausea. Negative for blood in stool, constipation, diarrhea, melena and vomiting.  Genitourinary: Negative.   Skin: Negative.   Neurological: Negative.   Psychiatric/Behavioral: Negative.        Physical Exam Blood pressure 134/79, pulse 89, temperature 98.3 F (36.8 C), temperature source Oral, height '6\' 4"'$  (1.93 m), weight (!) 380 lb (172.4 kg), SpO2 96 %. Last Weight  Most recent update: 03/19/2022 11:27 AM    Weight  172.4 kg (380 lb)  CONSTITUTIONAL: Well developed, and nourished, appropriately responsive and aware without distress.  Morbidly obese male. EYES: Sclera non-icteric.   EARS, NOSE, MOUTH AND THROAT:  The oropharynx is clear. Oral mucosa is pink and moist.   Hearing is intact to voice.  NECK: Trachea is midline, and there is no jugular venous distension.  LYMPH NODES:  Lymph nodes in the neck are not enlarged. RESPIRATORY:  Lungs are clear, and breath sounds are equal bilaterally. Normal respiratory effort without pathologic use of accessory muscles. CARDIOVASCULAR: Heart is regular in rate and rhythm. GI: The abdomen is soft, nontender, and nondistended. There were no palpable masses. I did not appreciate  hepatosplenomegaly. There were normal bowel sounds. MUSCULOSKELETAL:  Symmetrical muscle tone appreciated in all four extremities.    SKIN: Skin turgor is normal. No pathologic skin lesions appreciated.  NEUROLOGIC:  Motor and sensation appear grossly normal.  Cranial nerves are grossly without defect. PSYCH:  Alert and oriented to person, place and time. Affect is appropriate for situation.  Data Reviewed I have personally reviewed what is currently available of the patient's imaging, recent labs and medical records.   Labs:     Latest Ref Rng & Units 03/12/2022   10:11 PM 03/04/2016    2:05 AM 02/09/2014    2:03 PM  CBC  WBC 4.0 - 10.5 K/uL 6.6  6.6  5.5   Hemoglobin 13.0 - 17.0 g/dL 13.2  13.2  13.2   Hematocrit 39.0 - 52.0 % 38.6  36.9  37.7   Platelets 150 - 400 K/uL 177  162  186       Latest Ref Rng & Units 03/12/2022   10:11 PM 03/04/2016    2:05 AM 02/09/2014    2:03 PM  CMP  Glucose 70 - 99 mg/dL 146  139  85   BUN 8 - 23 mg/dL '20  20  17   '$ Creatinine 0.61 - 1.24 mg/dL 1.21  1.17  1.15   Sodium 135 - 145 mmol/L 139  137  139   Potassium 3.5 - 5.1 mmol/L 4.1  3.4  4.1   Chloride 98 - 111 mmol/L 103  104  106   CO2 22 - 32 mmol/L '30  24  28   '$ Calcium 8.9 - 10.3 mg/dL 8.8  8.5  8.5   Total Protein 6.5 - 8.1 g/dL 7.0  6.7    Total Bilirubin 0.3 - 1.2 mg/dL 0.8  0.4    Alkaline Phos 38 - 126 U/L 66  65    AST 15 - 41 U/L 17  18    ALT 0 - 44 U/L 11  9        Imaging: Radiology review:  CLINICAL DATA:  Acute aortic syndrome. Left side pain rating to the back and abdomen.   EXAM: CT ANGIOGRAPHY CHEST, ABDOMEN AND PELVIS   TECHNIQUE: Non-contrast CT of the chest was initially obtained.   Multidetector CT imaging through the chest, abdomen and pelvis was performed using the standard protocol during bolus administration of intravenous contrast. Multiplanar reconstructed images and MIPs were obtained and reviewed to evaluate the vascular anatomy.   RADIATION DOSE  REDUCTION: This exam was performed according to the departmental dose-optimization program which includes automated exposure control, adjustment of the mA and/or kV according to patient size and/or use of iterative reconstruction technique.   CONTRAST:  130m OMNIPAQUE IOHEXOL 350 MG/ML SOLN   COMPARISON:  Abdomen pelvis CT 03/04/2016.   FINDINGS: CTA CHEST FINDINGS   Cardiovascular:  Pre contrast imaging shows no hyperdense crescent in the wall of the thoracic aorta to suggest the presence of an acute intramural hematoma. Heart size upper normal. No pericardial effusion. Ascending thoracic aorta measures 4.5 cm diameter. No evidence for dissection flap within the thoracic aorta. No large central pulmonary embolus in the pulmonary outflow tract or main pulmonary arteries.   Mediastinum/Nodes: No mediastinal lymphadenopathy. There is no hilar lymphadenopathy. The esophagus has normal imaging features. There is no axillary lymphadenopathy.   Lungs/Pleura: 5 mm right middle lobe pulmonary nodule identified on image 74/6. No focal airspace consolidation. No pleural effusion.   Musculoskeletal: No worrisome lytic or sclerotic osseous abnormality.   Review of the MIP images confirms the above findings.   CTA ABDOMEN AND PELVIS FINDINGS   VASCULAR   Aorta: Normal caliber aorta without aneurysm, dissection, vasculitis or significant stenosis.   Celiac: Patent without evidence of aneurysm, dissection, vasculitis or significant stenosis.   SMA: Patent without evidence of aneurysm, dissection, vasculitis or significant stenosis.   Renals: Both renal arteries are patent without evidence of aneurysm, dissection, vasculitis, fibromuscular dysplasia or significant stenosis.   IMA: Patent without evidence of aneurysm, dissection, vasculitis or significant stenosis.   Inflow: Patent without evidence of aneurysm, dissection, vasculitis or significant stenosis.   Veins: No obvious  venous abnormality within the limitations of this arterial phase study.   Review of the MIP images confirms the above findings.   NON-VASCULAR   Hepatobiliary: Similar appearance left hepatic cyst. 10 mm calcified gallstone evident. No intrahepatic or extrahepatic biliary dilation.   Pancreas: No focal mass lesion. No dilatation of the main duct. No intraparenchymal cyst. No peripancreatic edema.   Spleen: No splenomegaly. No focal mass lesion.   Adrenals/Urinary Tract: No adrenal nodule or mass. Interval progression of a 3.9 cm hypoattenuating lesion in the interpolar left kidney measuring water density, compatible with a cyst. Left kidney unremarkable. No evidence for hydroureter. The urinary bladder appears normal for the degree of distention.   Stomach/Bowel: Stomach is unremarkable. No gastric wall thickening. No evidence of outlet obstruction. Duodenum is normally positioned as is the ligament of Treitz. No small bowel wall thickening. No small bowel dilatation. The terminal ileum is normal. The appendix is not well visualized, but there is no edema or inflammation in the region of the cecum. No gross colonic mass. No colonic wall thickening.   Lymphatic: No abdominal lymphadenopathy. 14 mm short axis right common femoral lymph node is not substantially changed in the interval, consistent with reactive etiology.   Reproductive: The prostate gland and seminal vesicles are unremarkable.   Other: No intraperitoneal free fluid.   Musculoskeletal: Left groin hernia contains only fat. Tiny umbilical hernia contains only fat. No worrisome lytic or sclerotic osseous abnormality.   Review of the MIP images confirms the above findings.   IMPRESSION: 1. No evidence for acute intramural hematoma or dissection in the thoracic or abdominal aorta. 2. 4.5 cm ascending thoracic aortic aneurysm. Recommend semi-annual imaging followup by CTA or MRA and referral to  cardiothoracic surgery if not already obtained. This recommendation follows 2010 ACCF/AHA/AATS/ACR/ASA/SCA/SCAI/SIR/STS/SVM Guidelines for the Diagnosis and Management of Patients With Thoracic Aortic Disease. Circulation. 2010; 121: J696-V893. Aortic aneurysm NOS (ICD10-I71.9) 3. 5 mm right middle lobe pulmonary nodule. No follow-up needed if patient is low-risk.This recommendation follows the consensus statement: Guidelines for Management of Incidental Pulmonary Nodules Detected on CT Images: From the Fleischner Society 2017; Radiology 2017; 284:228-243. 4. Cholelithiasis without evidence for cholecystitis. 5. Left groin  hernia contains only fat. 6. Tiny umbilical hernia contains only fat.     Electronically Signed   By: Misty Stanley M.D.   On: 03/13/2022 07:41   CLINICAL DATA:  Upper abdominal pain for 1 day   EXAM: ULTRASOUND ABDOMEN LIMITED RIGHT UPPER QUADRANT   COMPARISON:  07/01/2013; CT chest abdomen pelvis 03/13/2022   FINDINGS: Image quality degraded secondary to body habitus.   Gallbladder:   Normally distended. Shadowing calculus at lower gallbladder segment 26 mm diameter. No gallbladder wall thickening, pericholecystic fluid or sonographic Murphy sign.   Common bile duct:   Diameter: 5 mm, normal   Liver:   Echogenic parenchyma, likely fatty infiltration though this can be seen with cirrhosis and certain infiltrative disorders. Suboptimal assessment of intrahepatic detail due to sound attenuation based on increased hepatic echogenicity and body habitus. Simple cyst identified anteriorly LEFT lobe 19 x 23 x 33 mm. No additional mass or nodularity. Portal vein is patent on color Doppler imaging with normal direction of blood flow towards the liver.   Other: No RIGHT upper quadrant free fluid.   IMPRESSION: Cholelithiasis without evidence acute cholecystitis.   Probable fatty infiltration of liver as above.   LEFT lobe simple hepatic cyst 33 mm  diameter.     Electronically Signed   By: Lavonia Dana M.D.   On: 03/13/2022 08:57  Assessment    Chronic calculus cholecystitis Patient Active Problem List   Diagnosis Date Noted   Aneurysm of ascending aorta without rupture ([place]) 03/19/2022   CCC (chronic calculous cholecystitis) 03/19/2022   Solitary pulmonary nodule 03/19/2022    Plan    Robotic cholecystectomy with ICG imaging.  This was discussed thoroughly.  Optimal plan is for robotic cholecystectomy utilizing ICG imaging. Risks and benefits have been discussed with the patient which include but are not limited to anesthesia, bleeding, infection, biliary ductal injury, resulting in leak or stenosis, other associated unanticipated injuries affiliated with laparoscopic surgery.   Reviewed that removing the gallbladder will only address the symptoms related to the gallbladder itself.  I believe there is the desire to proceed, accepting the risks with understanding.  Questions elicited and answered to satisfaction.    No guarantees ever expressed or implied.  Face-to-face time spent with the patient and accompanying care providers(if present) was 45 minutes, with more than 50% of the time spent counseling, educating, and coordinating care of the patient.    These notes generated with voice recognition software. I apologize for typographical errors.  [name] M.D., FACS 03/19/2022, 12:21 PM

## 2022-03-19 NOTE — H&P (View-Only) (Signed)
Patient ID: Edwin Ruiz, male   DOB: May 11, 1956, 66 y.o.   MRN: [id number]  Chief Complaint: Upper abdominal pain  History of Present Illness Edwin Ruiz is a 66 y.o. male with an ED visit approximate 1 week ago for cute onset of ill-defined upper abdominal pain which seems to be more left upper quadrant.  It progressed to a bandlike distribution.  The pain radiated to his back, had associated nausea without vomiting fevers or chills.  Reports normal bowel activity.  He was subsequently worked up in the ED with a CT scan and eventual ultrasound.  His pain improved with pain medications and the duration.  He reports this began after a [place] dinner.  He has been careful with his diet since his ED evaluation.  Suspects he may have had subtle forms of postprandial pain prior to this event.  Past Medical History Past Medical History:  Diagnosis Date   Arthritis    Asthma    GERD (gastroesophageal reflux disease)    Hematuria    Hypertension    Presence of permanent cardiac pacemaker    Sleep apnea    Urticaria       Past Surgical History:  Procedure Laterality Date   APPENDECTOMY     COLONOSCOPY WITH PROPOFOL N/A 02/05/2016   Procedure: COLONOSCOPY WITH PROPOFOL;  Surgeon: Lollie Sails, MD;  Location: [facility] ENDOSCOPY;  Service: Endoscopy;  Laterality: N/A;   FOOT SURGERY     INSERT / REPLACE / REMOVE PACEMAKER     PPM GENERATOR CHANGEOUT N/A 01/16/2022   Procedure: PPM GENERATOR CHANGEOUT;  Surgeon: Isaias Cowman, MD;  Location: [place] [facility];  Service: Cardiovascular;  Laterality: N/A;   TONSILLECTOMY      Allergies  Allergen Reactions   Penicillins     Current Outpatient Medications  Medication Sig Dispense Refill   acetaminophen (TYLENOL) 500 MG tablet Take 500 mg by mouth every 6 (six) hours as needed.     losartan (COZAAR) 100 MG tablet Take 100 mg by mouth daily.     meloxicam (MOBIC) 15 MG tablet Take 15 mg by mouth daily.     omeprazole (PRILOSEC)  20 MG capsule Take 20 mg by mouth daily.     tadalafil (CIALIS) 20 MG tablet Take 20 mg by mouth daily as needed for erectile dysfunction.     No current facility-administered medications for this visit.    Family History History reviewed. No pertinent family history.    Social History Social History   Tobacco Use   Smoking status: Former   Smokeless tobacco: Never  Substance Use Topics   Alcohol use: Yes    Comment: occasional   Drug use: No        Review of Systems  Constitutional:  Positive for diaphoresis and fever.  HENT:  Positive for tinnitus.   Eyes: Negative.   Respiratory:  Positive for shortness of breath.   Cardiovascular:  Positive for leg swelling.  Gastrointestinal:  Positive for abdominal pain and nausea. Negative for blood in stool, constipation, diarrhea, melena and vomiting.  Genitourinary: Negative.   Skin: Negative.   Neurological: Negative.   Psychiatric/Behavioral: Negative.        Physical Exam Blood pressure 134/79, pulse 89, temperature 98.3 F (36.8 C), temperature source Edwin, height '6\' 4"'$  (1.93 m), weight (!) 380 lb (172.4 kg), SpO2 96 %. Last Weight  Most recent update: 03/19/2022 11:27 AM    Weight  172.4 kg (380 lb)  CONSTITUTIONAL: Well developed, and nourished, appropriately responsive and aware without distress.  Morbidly obese male. EYES: Sclera non-icteric.   EARS, NOSE, MOUTH AND THROAT:  The oropharynx is clear. Edwin mucosa is pink and moist.   Hearing is intact to voice.  NECK: Trachea is midline, and there is no jugular venous distension.  LYMPH NODES:  Lymph nodes in the neck are not enlarged. RESPIRATORY:  Lungs are clear, and breath sounds are equal bilaterally. Normal respiratory effort without pathologic use of accessory muscles. CARDIOVASCULAR: Heart is regular in rate and rhythm. GI: The abdomen is soft, nontender, and nondistended. There were no palpable masses. I did not appreciate  hepatosplenomegaly. There were normal bowel sounds. MUSCULOSKELETAL:  Symmetrical muscle tone appreciated in all four extremities.    SKIN: Skin turgor is normal. No pathologic skin lesions appreciated.  NEUROLOGIC:  Motor and sensation appear grossly normal.  Cranial nerves are grossly without defect. PSYCH:  Alert and oriented to person, place and time. Affect is appropriate for situation.  Data Reviewed I have personally reviewed what is currently available of the patient's imaging, recent labs and medical records.   Labs:     Latest Ref Rng & Units 03/12/2022   10:11 PM 03/04/2016    2:05 AM 02/09/2014    2:03 PM  CBC  WBC 4.0 - 10.5 K/uL 6.6  6.6  5.5   Hemoglobin 13.0 - 17.0 g/dL 13.2  13.2  13.2   Hematocrit 39.0 - 52.0 % 38.6  36.9  37.7   Platelets 150 - 400 K/uL 177  162  186       Latest Ref Rng & Units 03/12/2022   10:11 PM 03/04/2016    2:05 AM 02/09/2014    2:03 PM  CMP  Glucose 70 - 99 mg/dL 146  139  85   BUN 8 - 23 mg/dL '20  20  17   '$ Creatinine 0.61 - 1.24 mg/dL 1.21  1.17  1.15   Sodium 135 - 145 mmol/L 139  137  139   Potassium 3.5 - 5.1 mmol/L 4.1  3.4  4.1   Chloride 98 - 111 mmol/L 103  104  106   CO2 22 - 32 mmol/L '30  24  28   '$ Calcium 8.9 - 10.3 mg/dL 8.8  8.5  8.5   Total Protein 6.5 - 8.1 g/dL 7.0  6.7    Total Bilirubin 0.3 - 1.2 mg/dL 0.8  0.4    Alkaline Phos 38 - 126 U/L 66  65    AST 15 - 41 U/L 17  18    ALT 0 - 44 U/L 11  9        Imaging: Radiology review:  CLINICAL DATA:  Acute aortic syndrome. Left side pain rating to the back and abdomen.   EXAM: CT ANGIOGRAPHY CHEST, ABDOMEN AND PELVIS   TECHNIQUE: Non-contrast CT of the chest was initially obtained.   Multidetector CT imaging through the chest, abdomen and pelvis was performed using the standard protocol during bolus administration of intravenous contrast. Multiplanar reconstructed images and MIPs were obtained and reviewed to evaluate the vascular anatomy.   RADIATION DOSE  REDUCTION: This exam was performed according to the departmental dose-optimization program which includes automated exposure control, adjustment of the mA and/or kV according to patient size and/or use of iterative reconstruction technique.   CONTRAST:  137m OMNIPAQUE IOHEXOL 350 MG/ML SOLN   COMPARISON:  Abdomen pelvis CT 03/04/2016.   FINDINGS: CTA CHEST FINDINGS   Cardiovascular:  Pre contrast imaging shows no hyperdense crescent in the wall of the thoracic aorta to suggest the presence of an acute intramural hematoma. Heart size upper normal. No pericardial effusion. Ascending thoracic aorta measures 4.5 cm diameter. No evidence for dissection flap within the thoracic aorta. No large central pulmonary embolus in the pulmonary outflow tract or main pulmonary arteries.   Mediastinum/Nodes: No mediastinal lymphadenopathy. There is no hilar lymphadenopathy. The esophagus has normal imaging features. There is no axillary lymphadenopathy.   Lungs/Pleura: 5 mm right middle lobe pulmonary nodule identified on image 74/6. No focal airspace consolidation. No pleural effusion.   Musculoskeletal: No worrisome lytic or sclerotic osseous abnormality.   Review of the MIP images confirms the above findings.   CTA ABDOMEN AND PELVIS FINDINGS   VASCULAR   Aorta: Normal caliber aorta without aneurysm, dissection, vasculitis or significant stenosis.   Celiac: Patent without evidence of aneurysm, dissection, vasculitis or significant stenosis.   SMA: Patent without evidence of aneurysm, dissection, vasculitis or significant stenosis.   Renals: Both renal arteries are patent without evidence of aneurysm, dissection, vasculitis, fibromuscular dysplasia or significant stenosis.   IMA: Patent without evidence of aneurysm, dissection, vasculitis or significant stenosis.   Inflow: Patent without evidence of aneurysm, dissection, vasculitis or significant stenosis.   Veins: No obvious  venous abnormality within the limitations of this arterial phase study.   Review of the MIP images confirms the above findings.   NON-VASCULAR   Hepatobiliary: Similar appearance left hepatic cyst. 10 mm calcified gallstone evident. No intrahepatic or extrahepatic biliary dilation.   Pancreas: No focal mass lesion. No dilatation of the main duct. No intraparenchymal cyst. No peripancreatic edema.   Spleen: No splenomegaly. No focal mass lesion.   Adrenals/Urinary Tract: No adrenal nodule or mass. Interval progression of a 3.9 cm hypoattenuating lesion in the interpolar left kidney measuring water density, compatible with a cyst. Left kidney unremarkable. No evidence for hydroureter. The urinary bladder appears normal for the degree of distention.   Stomach/Bowel: Stomach is unremarkable. No gastric wall thickening. No evidence of outlet obstruction. Duodenum is normally positioned as is the ligament of Treitz. No small bowel wall thickening. No small bowel dilatation. The terminal ileum is normal. The appendix is not well visualized, but there is no edema or inflammation in the region of the cecum. No gross colonic mass. No colonic wall thickening.   Lymphatic: No abdominal lymphadenopathy. 14 mm short axis right common femoral lymph node is not substantially changed in the interval, consistent with reactive etiology.   Reproductive: The prostate gland and seminal vesicles are unremarkable.   Other: No intraperitoneal free fluid.   Musculoskeletal: Left groin hernia contains only fat. Tiny umbilical hernia contains only fat. No worrisome lytic or sclerotic osseous abnormality.   Review of the MIP images confirms the above findings.   IMPRESSION: 1. No evidence for acute intramural hematoma or dissection in the thoracic or abdominal aorta. 2. 4.5 cm ascending thoracic aortic aneurysm. Recommend semi-annual imaging followup by CTA or MRA and referral to  cardiothoracic surgery if not already obtained. This recommendation follows 2010 ACCF/AHA/AATS/ACR/ASA/SCA/SCAI/SIR/STS/SVM Guidelines for the Diagnosis and Management of Patients With Thoracic Aortic Disease. Circulation. 2010; 121: I712-W580. Aortic aneurysm NOS (ICD10-I71.9) 3. 5 mm right middle lobe pulmonary nodule. No follow-up needed if patient is low-risk.This recommendation follows the consensus statement: Guidelines for Management of Incidental Pulmonary Nodules Detected on CT Images: From the Fleischner Society 2017; Radiology 2017; 284:228-243. 4. Cholelithiasis without evidence for cholecystitis. 5. Left groin  hernia contains only fat. 6. Tiny umbilical hernia contains only fat.     Electronically Signed   By: Misty Stanley M.D.   On: 03/13/2022 07:41   CLINICAL DATA:  Upper abdominal pain for 1 day   EXAM: ULTRASOUND ABDOMEN LIMITED RIGHT UPPER QUADRANT   COMPARISON:  07/01/2013; CT chest abdomen pelvis 03/13/2022   FINDINGS: Image quality degraded secondary to body habitus.   Gallbladder:   Normally distended. Shadowing calculus at lower gallbladder segment 26 mm diameter. No gallbladder wall thickening, pericholecystic fluid or sonographic Murphy sign.   Common bile duct:   Diameter: 5 mm, normal   Liver:   Echogenic parenchyma, likely fatty infiltration though this can be seen with cirrhosis and certain infiltrative disorders. Suboptimal assessment of intrahepatic detail due to sound attenuation based on increased hepatic echogenicity and body habitus. Simple cyst identified anteriorly LEFT lobe 19 x 23 x 33 mm. No additional mass or nodularity. Portal vein is patent on color Doppler imaging with normal direction of blood flow towards the liver.   Other: No RIGHT upper quadrant free fluid.   IMPRESSION: Cholelithiasis without evidence acute cholecystitis.   Probable fatty infiltration of liver as above.   LEFT lobe simple hepatic cyst 33 mm  diameter.     Electronically Signed   By: Lavonia Dana M.D.   On: 03/13/2022 08:57  Assessment    Chronic calculus cholecystitis Patient Active Problem List   Diagnosis Date Noted   Aneurysm of ascending aorta without rupture ([place]) 03/19/2022   CCC (chronic calculous cholecystitis) 03/19/2022   Solitary pulmonary nodule 03/19/2022    Plan    Robotic cholecystectomy with ICG imaging.  This was discussed thoroughly.  Optimal plan is for robotic cholecystectomy utilizing ICG imaging. Risks and benefits have been discussed with the patient which include but are not limited to anesthesia, bleeding, infection, biliary ductal injury, resulting in leak or stenosis, other associated unanticipated injuries affiliated with laparoscopic surgery.   Reviewed that removing the gallbladder will only address the symptoms related to the gallbladder itself.  I believe there is the desire to proceed, accepting the risks with understanding.  Questions elicited and answered to satisfaction.    No guarantees ever expressed or implied.  Face-to-face time spent with the patient and accompanying care providers(if present) was 45 minutes, with more than 50% of the time spent counseling, educating, and coordinating care of the patient.    These notes generated with voice recognition software. I apologize for typographical errors.  [name] M.D., FACS 03/19/2022, 12:21 PM

## 2022-03-19 NOTE — Telephone Encounter (Signed)
Patient has been advised of Pre-Admission date/time, and Surgery date.  Surgery Date: 03/20/22 @ [place] (this is an add on for next day)  Preadmission Testing Date: 03/20/22 patient to arrive 2 hours early, arrival time 7:30 am.  Patient reminded to be NPO after midnight.

## 2022-03-20 ENCOUNTER — Other Ambulatory Visit: Payer: Self-pay

## 2022-03-20 ENCOUNTER — Observation Stay
Admission: RE | Admit: 2022-03-20 | Discharge: 2022-03-21 | Disposition: A | Discharge status: Home / Self Care | Payer: Medicare PPO | Attending: Surgery | Admitting: Surgery

## 2022-03-20 ENCOUNTER — Ambulatory Visit: Payer: Medicare PPO | Admitting: Anesthesiology

## 2022-03-20 ENCOUNTER — Encounter
Admission: RE | Disposition: A | Discharge status: Home / Self Care | Payer: Self-pay | Source: Home / Self Care | Attending: Surgery

## 2022-03-20 ENCOUNTER — Encounter: Payer: Self-pay | Admitting: Surgery

## 2022-03-20 DIAGNOSIS — Z950 Presence of cardiac pacemaker: Secondary | ICD-10-CM | POA: Insufficient documentation

## 2022-03-20 DIAGNOSIS — I10 Essential (primary) hypertension: Secondary | ICD-10-CM | POA: Insufficient documentation

## 2022-03-20 DIAGNOSIS — Z79899 Other long term (current) drug therapy: Secondary | ICD-10-CM | POA: Diagnosis not present

## 2022-03-20 DIAGNOSIS — Z87891 Personal history of nicotine dependence: Secondary | ICD-10-CM | POA: Insufficient documentation

## 2022-03-20 DIAGNOSIS — J45909 Unspecified asthma, uncomplicated: Secondary | ICD-10-CM | POA: Diagnosis not present

## 2022-03-20 DIAGNOSIS — K8012 Calculus of gallbladder with acute and chronic cholecystitis without obstruction: Secondary | ICD-10-CM | POA: Diagnosis not present

## 2022-03-20 DIAGNOSIS — K8000 Calculus of gallbladder with acute cholecystitis without obstruction: Secondary | ICD-10-CM | POA: Diagnosis not present

## 2022-03-20 DIAGNOSIS — K8010 Calculus of gallbladder with chronic cholecystitis without obstruction: Secondary | ICD-10-CM

## 2022-03-20 DIAGNOSIS — R1012 Left upper quadrant pain: Secondary | ICD-10-CM | POA: Diagnosis present

## 2022-03-20 HISTORY — DX: Calculus of gallbladder with acute cholecystitis without obstruction: K80.00

## 2022-03-20 SURGERY — CHOLECYSTECTOMY, ROBOT-ASSISTED, LAPAROSCOPIC
Anesthesia: General | Site: Abdomen

## 2022-03-20 MED ORDER — ONDANSETRON 4 MG PO TBDP: 4.0000 mg | ORAL_TABLET | Freq: Four times a day (QID) | ORAL | Status: DC | PRN

## 2022-03-20 MED ORDER — FENTANYL CITRATE (PF) 100 MCG/2ML IJ SOLN
INTRAMUSCULAR | Status: AC
  Administered 2022-03-20: 25 ug via INTRAVENOUS
  Filled 2022-03-20: qty 2

## 2022-03-20 MED ORDER — CHLORHEXIDINE GLUCONATE CLOTH 2 % EX PADS
6.0000 | MEDICATED_PAD | Freq: Once | CUTANEOUS | Status: AC
  Administered 2022-03-20: 6 via TOPICAL

## 2022-03-20 MED ORDER — SODIUM CHLORIDE 0.9 % IR SOLN
Status: DC | PRN
  Administered 2022-03-20: 1000 mL

## 2022-03-20 MED ORDER — SUCCINYLCHOLINE CHLORIDE 200 MG/10ML IV SOSY
PREFILLED_SYRINGE | INTRAVENOUS | Status: DC | PRN
  Administered 2022-03-20: 160 mg via INTRAVENOUS

## 2022-03-20 MED ORDER — PROPOFOL 10 MG/ML IV BOLUS
INTRAVENOUS | Status: DC | PRN
  Administered 2022-03-20: 170 mg via INTRAVENOUS

## 2022-03-20 MED ORDER — FENTANYL CITRATE (PF) 100 MCG/2ML IJ SOLN
INTRAMUSCULAR | Status: DC | PRN
Start: 2022-03-20 — End: 2022-03-20
  Administered 2022-03-20: 25 ug via INTRAVENOUS
  Administered 2022-03-20: 50 ug via INTRAVENOUS
  Administered 2022-03-20: 25 ug via INTRAVENOUS

## 2022-03-20 MED ORDER — FENTANYL CITRATE (PF) 100 MCG/2ML IJ SOLN
INTRAMUSCULAR | Status: AC
  Filled 2022-03-20: qty 2

## 2022-03-20 MED ORDER — MIDAZOLAM HCL 2 MG/2ML IJ SOLN
INTRAMUSCULAR | Status: DC | PRN
  Administered 2022-03-20 (×2): 1 mg via INTRAVENOUS

## 2022-03-20 MED ORDER — ONDANSETRON HCL 4 MG/2ML IJ SOLN
INTRAMUSCULAR | Status: AC
  Filled 2022-03-20: qty 2

## 2022-03-20 MED ORDER — PANTOPRAZOLE SODIUM 40 MG IV SOLR
40.0000 mg | Freq: Every day | INTRAVENOUS | Status: DC
  Administered 2022-03-20: 40 mg via INTRAVENOUS
  Filled 2022-03-20: qty 10

## 2022-03-20 MED ORDER — MIDAZOLAM HCL 2 MG/2ML IJ SOLN
INTRAMUSCULAR | Status: AC
Start: 2022-03-20 — End: ?
  Filled 2022-03-20: qty 2

## 2022-03-20 MED ORDER — CHLORHEXIDINE GLUCONATE 0.12 % MT SOLN
OROMUCOSAL | Status: AC
  Administered 2022-03-20: 15 mL via OROMUCOSAL
  Filled 2022-03-20: qty 15

## 2022-03-20 MED ORDER — OXYCODONE HCL 5 MG PO TABS
5.0000 mg | ORAL_TABLET | ORAL | Status: DC | PRN
  Administered 2022-03-20: 5 mg via ORAL
  Filled 2022-03-20: qty 2

## 2022-03-20 MED ORDER — SODIUM CHLORIDE 0.9 % IV SOLN: INTRAVENOUS | Status: DC

## 2022-03-20 MED ORDER — EPHEDRINE SULFATE (PRESSORS) 50 MG/ML IJ SOLN
INTRAMUSCULAR | Status: DC | PRN
  Administered 2022-03-20: 10 mg via INTRAVENOUS

## 2022-03-20 MED ORDER — ORAL CARE MOUTH RINSE: 15.0000 mL | Freq: Once | OROMUCOSAL | Status: AC

## 2022-03-20 MED ORDER — LIDOCAINE HCL (CARDIAC) PF 100 MG/5ML IV SOSY
PREFILLED_SYRINGE | INTRAVENOUS | Status: DC | PRN
  Administered 2022-03-20: 100 mg via INTRAVENOUS

## 2022-03-20 MED ORDER — ACETAMINOPHEN 500 MG PO TABS
ORAL_TABLET | ORAL | Status: AC
  Administered 2022-03-20: 1000 mg via ORAL
  Filled 2022-03-20: qty 2

## 2022-03-20 MED ORDER — ONDANSETRON HCL 4 MG/2ML IJ SOLN: 4.0000 mg | Freq: Four times a day (QID) | INTRAMUSCULAR | Status: DC | PRN

## 2022-03-20 MED ORDER — DEXAMETHASONE SODIUM PHOSPHATE 10 MG/ML IJ SOLN
INTRAMUSCULAR | Status: AC
  Filled 2022-03-20: qty 1

## 2022-03-20 MED ORDER — ONDANSETRON HCL 4 MG/2ML IJ SOLN: 4.0000 mg | Freq: Once | INTRAMUSCULAR | Status: DC | PRN

## 2022-03-20 MED ORDER — PHENYLEPHRINE HCL-NACL 20-0.9 MG/250ML-% IV SOLN
INTRAVENOUS | Status: DC | PRN
  Administered 2022-03-20: 40 ug/min via INTRAVENOUS

## 2022-03-20 MED ORDER — ACETAMINOPHEN 500 MG PO TABS: 1000.0000 mg | ORAL_TABLET | ORAL | Status: AC

## 2022-03-20 MED ORDER — DIPHENHYDRAMINE HCL 50 MG/ML IJ SOLN: 12.5000 mg | Freq: Four times a day (QID) | INTRAMUSCULAR | Status: DC | PRN

## 2022-03-20 MED ORDER — CIPROFLOXACIN IN D5W 400 MG/200ML IV SOLN
400.0000 mg | Freq: Two times a day (BID) | INTRAVENOUS | Status: DC
  Administered 2022-03-20 – 2022-03-21 (×2): 400 mg via INTRAVENOUS
  Filled 2022-03-20 (×3): qty 200

## 2022-03-20 MED ORDER — OXYCODONE HCL 5 MG PO TABS
ORAL_TABLET | ORAL | Status: AC
  Administered 2022-03-20: 5 mg via ORAL
  Filled 2022-03-20: qty 1

## 2022-03-20 MED ORDER — DIPHENHYDRAMINE HCL 12.5 MG/5ML PO ELIX: 12.5000 mg | ORAL_SOLUTION | Freq: Four times a day (QID) | ORAL | Status: DC | PRN

## 2022-03-20 MED ORDER — GABAPENTIN 300 MG PO CAPS: 300.0000 mg | ORAL_CAPSULE | ORAL | Status: AC

## 2022-03-20 MED ORDER — MELOXICAM 7.5 MG PO TABS
15.0000 mg | ORAL_TABLET | Freq: Every day | ORAL | Status: DC
  Administered 2022-03-20 – 2022-03-21 (×2): 15 mg via ORAL
  Filled 2022-03-20 (×2): qty 2

## 2022-03-20 MED ORDER — BUPIVACAINE LIPOSOME 1.3 % IJ SUSP: 20.0000 mL | Freq: Once | INTRAMUSCULAR | Status: DC

## 2022-03-20 MED ORDER — GABAPENTIN 300 MG PO CAPS
ORAL_CAPSULE | ORAL | Status: AC
  Administered 2022-03-20: 300 mg via ORAL
  Filled 2022-03-20: qty 1

## 2022-03-20 MED ORDER — LACTATED RINGERS IV SOLN: INTRAVENOUS | Status: DC

## 2022-03-20 MED ORDER — DEXAMETHASONE SODIUM PHOSPHATE 10 MG/ML IJ SOLN
INTRAMUSCULAR | Status: DC | PRN
  Administered 2022-03-20: 10 mg via INTRAVENOUS

## 2022-03-20 MED ORDER — SUGAMMADEX SODIUM 500 MG/5ML IV SOLN
INTRAVENOUS | Status: DC | PRN
  Administered 2022-03-20: 320 mg via INTRAVENOUS

## 2022-03-20 MED ORDER — ROCURONIUM BROMIDE 10 MG/ML (PF) SYRINGE
PREFILLED_SYRINGE | INTRAVENOUS | Status: AC
  Filled 2022-03-20: qty 10

## 2022-03-20 MED ORDER — CHLORHEXIDINE GLUCONATE 0.12 % MT SOLN: 15.0000 mL | Freq: Once | OROMUCOSAL | Status: AC

## 2022-03-20 MED ORDER — BUPIVACAINE HCL (PF) 0.25 % IJ SOLN
INTRAMUSCULAR | Status: AC
  Filled 2022-03-20: qty 30

## 2022-03-20 MED ORDER — BUPIVACAINE-EPINEPHRINE (PF) 0.25% -1:200000 IJ SOLN
INTRAMUSCULAR | Status: DC | PRN
  Administered 2022-03-20: 30 mL

## 2022-03-20 MED ORDER — ROCURONIUM BROMIDE 100 MG/10ML IV SOLN
INTRAVENOUS | Status: DC | PRN
  Administered 2022-03-20: 15 mg via INTRAVENOUS
  Administered 2022-03-20: 30 mg via INTRAVENOUS
  Administered 2022-03-20: 10 mg via INTRAVENOUS
  Administered 2022-03-20: 15 mg via INTRAVENOUS

## 2022-03-20 MED ORDER — LIDOCAINE HCL (PF) 2 % IJ SOLN
INTRAMUSCULAR | Status: AC
  Filled 2022-03-20: qty 5

## 2022-03-20 MED ORDER — PROPOFOL 10 MG/ML IV BOLUS
INTRAVENOUS | Status: AC
Start: 2022-03-20 — End: ?
  Filled 2022-03-20: qty 20

## 2022-03-20 MED ORDER — LOSARTAN POTASSIUM 50 MG PO TABS
100.0000 mg | ORAL_TABLET | Freq: Every day | ORAL | Status: DC
  Administered 2022-03-20 – 2022-03-21 (×2): 100 mg via ORAL
  Filled 2022-03-20 (×2): qty 2

## 2022-03-20 MED ORDER — CIPROFLOXACIN IN D5W 400 MG/200ML IV SOLN
400.0000 mg | INTRAVENOUS | Status: AC
  Administered 2022-03-20: 400 mg via INTRAVENOUS

## 2022-03-20 MED ORDER — PHENYLEPHRINE 80 MCG/ML (10ML) SYRINGE FOR IV PUSH (FOR BLOOD PRESSURE SUPPORT)
PREFILLED_SYRINGE | INTRAVENOUS | Status: DC | PRN
  Administered 2022-03-20: 160 ug via INTRAVENOUS
  Administered 2022-03-20: 80 ug via INTRAVENOUS
  Administered 2022-03-20: 160 ug via INTRAVENOUS
  Administered 2022-03-20 (×2): 80 ug via INTRAVENOUS
  Administered 2022-03-20: 160 ug via INTRAVENOUS

## 2022-03-20 MED ORDER — CIPROFLOXACIN IN D5W 400 MG/200ML IV SOLN
INTRAVENOUS | Status: AC
  Filled 2022-03-20: qty 200

## 2022-03-20 MED ORDER — HYDRALAZINE HCL 20 MG/ML IJ SOLN: 10.0000 mg | INTRAMUSCULAR | Status: DC | PRN

## 2022-03-20 MED ORDER — INDOCYANINE GREEN 25 MG IV SOLR
1.2500 mg | Freq: Once | INTRAVENOUS | Status: AC
  Administered 2022-03-20: 1.25 mg via INTRAVENOUS
  Filled 2022-03-20: qty 0.5

## 2022-03-20 MED ORDER — FENTANYL CITRATE (PF) 100 MCG/2ML IJ SOLN
25.0000 ug | INTRAMUSCULAR | Status: DC | PRN
  Administered 2022-03-20 (×4): 25 ug via INTRAVENOUS

## 2022-03-20 MED ORDER — HYDROMORPHONE HCL 1 MG/ML IJ SOLN: 1.0000 mg | INTRAMUSCULAR | Status: DC | PRN

## 2022-03-20 MED ORDER — ONDANSETRON HCL 4 MG/2ML IJ SOLN
INTRAMUSCULAR | Status: DC | PRN
  Administered 2022-03-20: 4 mg via INTRAVENOUS

## 2022-03-20 SURGICAL SUPPLY — 44 items
BAG PRESSURE INF REUSE 1000 (BAG) IMPLANT
BULB RESERV EVAC DRAIN JP 100C (MISCELLANEOUS) IMPLANT
CLIP LIGATING HEM O LOK PURPLE (MISCELLANEOUS) ×1 IMPLANT
COVER TIP SHEARS 8 DVNC (MISCELLANEOUS) ×1 IMPLANT
COVER TIP SHEARS 8MM DA VINCI (MISCELLANEOUS) ×1
DERMABOND ADVANCED (GAUZE/BANDAGES/DRESSINGS) ×1
DERMABOND ADVANCED .7 DNX12 (GAUZE/BANDAGES/DRESSINGS) ×1 IMPLANT
DRAIN CHANNEL JP 19F (MISCELLANEOUS) IMPLANT
DRAPE ARM DVNC X/XI (DISPOSABLE) ×4 IMPLANT
DRAPE COLUMN DVNC XI (DISPOSABLE) ×1 IMPLANT
DRAPE DA VINCI XI ARM (DISPOSABLE) ×4
DRAPE DA VINCI XI COLUMN (DISPOSABLE) ×1
ELECT CAUTERY BLADE 6.4 (BLADE) ×1 IMPLANT
GLOVE ORTHO TXT STRL SZ7.5 (GLOVE) ×2 IMPLANT
GOWN STRL REUS W/ TWL LRG LVL3 (GOWN DISPOSABLE) ×2 IMPLANT
GOWN STRL REUS W/ TWL XL LVL3 (GOWN DISPOSABLE) ×2 IMPLANT
GOWN STRL REUS W/TWL LRG LVL3 (GOWN DISPOSABLE) ×3
GOWN STRL REUS W/TWL XL LVL3 (GOWN DISPOSABLE) ×2
IRRIGATOR SUCT 8 DISP DVNC XI (IRRIGATION / IRRIGATOR) IMPLANT
IRRIGATOR SUCTION 8MM XI DISP (IRRIGATION / IRRIGATOR) ×1
IV NS 1000ML (IV SOLUTION) ×1
IV NS 1000ML BAXH (IV SOLUTION) IMPLANT
KIT PINK PAD W/HEAD ARE REST (MISCELLANEOUS) ×1 IMPLANT
KIT PINK PAD W/HEAD ARM REST (MISCELLANEOUS) ×1 IMPLANT
KIT TURNOVER KIT A (KITS) ×1 IMPLANT
LABEL OR SOLS (LABEL) ×1 IMPLANT
MANIFOLD NEPTUNE II (INSTRUMENTS) ×1 IMPLANT
NEEDLE HYPO 22GX1.5 SAFETY (NEEDLE) ×1 IMPLANT
NS IRRIG 500ML POUR BTL (IV SOLUTION) ×1 IMPLANT
PACK LAP CHOLECYSTECTOMY (MISCELLANEOUS) ×1 IMPLANT
SEAL CANN UNIV 5-8 DVNC XI (MISCELLANEOUS) ×4 IMPLANT
SEAL XI 5MM-8MM UNIVERSAL (MISCELLANEOUS) ×4
SET TUBE SMOKE EVAC HIGH FLOW (TUBING) ×1 IMPLANT
SOLUTION ELECTROLUBE (MISCELLANEOUS) ×1 IMPLANT
SUT ETHILON 3-0 (SUTURE) IMPLANT
SUT MNCRL 4-0 (SUTURE) ×1
SUT MNCRL 4-0 27XMFL (SUTURE) ×1
SUT MNCRL AB 4-0 PS2 18 (SUTURE) IMPLANT
SUT VICRYL 0 AB UR-6 (SUTURE) ×1 IMPLANT
SUTURE MNCRL 4-0 27XMF (SUTURE) ×1 IMPLANT
SYS BAG RETRIEVAL 10MM (BASKET) ×1
SYSTEM BAG RETRIEVAL 10MM (BASKET) ×1 IMPLANT
TROCAR Z-THREAD FIOS 11X100 BL (TROCAR) IMPLANT
WATER STERILE IRR 500ML POUR (IV SOLUTION) ×1 IMPLANT

## 2022-03-20 NOTE — Op Note (Signed)
Robotic cholecystectomy with Indocyamine Green Ductal Imaging.   Pre-operative Diagnosis: Chronic calculus cholecystitis  Post-operative Diagnosis: Acute calculus cholecystitis with gangrene  Procedure: Robotic assisted laparoscopic cholecystectomy with Indocyamine Green Ductal Imaging.   Surgeon: [name], M.D., FACS  Anesthesia: General. with endotracheal tube  Findings: Acute adhesive changes and edema, posterior gallbladder wall necrosis.  Estimated Blood Loss: 25 mL         Drains: None         Specimens: Gallbladder           Complications: none  Procedure Details  The patient was seen again in the Holding Room.  1.25 mg dose of ICG was administered intravenously.   The benefits, complications, treatment options, risks and expected outcomes were again reviewed with the patient. The likelihood of improving the patient's symptoms with return to their baseline status is good.  The patient and/or family concurred with the proposed plan, giving informed consent, again alternatives reviewed.  The patient was taken to Operating Room, identified, and the procedure verified as robotic assisted laparoscopic cholecystectomy.  Prior to the induction of general anesthesia, antibiotic prophylaxis was administered. VTE prophylaxis was in place. General endotracheal anesthesia was then administered and tolerated well. The patient was positioned in the supine position.  After the induction, the abdomen was prepped with Chloraprep and draped in the sterile fashion.  A Time Out was held and the above information confirmed.  After local infiltration of quarter percent Marcaine with epinephrine, stab incision was made left upper quadrant.  Just below the costal margin at Palmer's point, approximately midclavicular line the Veres needle is passed with sensation of the layers to penetrate the abdominal wall and into the peritoneum.  Saline drop test is confirmed peritoneal placement.  Insufflation  is initiated with carbon dioxide to pressures of 15 mmHg.  Right para-umbilical local infiltration with quarter percent Marcaine with epinephrine is utilized.  Made a 12 mm incision on the right periumbilical site, I advanced an optical 25m port under direct visualization into the peritoneal cavity.  Once the peritoneum was penetrated, insufflation was initiated.  The trocar was then advanced into the abdominal cavity under direct visualization. Pneumoperitoneum was then continued utilizing CO2 at 15 mmHg or less and tolerated well without any adverse changes in the patient's vital signs.  Two 8.5-mm ports were placed in the left lower quadrant and laterally, and one to the right lower quadrant, all under direct vision. All skin incisions  were infiltrated with a local anesthetic agent before making the incision and placing the trocars.  The patient was positioned  in reverse Trendelenburg, tilted the patient's left side down.  Da Vinci XI robot was then positioned on to the patient's left side, and docked.  The gallbladder was identified, the fundus grasped despite its distended nature and gallbladder wall edema via the arm 4 Prograsp and retracted cephalad.  This retraction was somewhat challenging due to the patient's body habitus.  Adhesions were lysed with scissors and cautery.  Again due to body habitus challenges it was challenging to manipulate the gallbladder, and visualize past the volume of bowels despite extreme reverse Trendelenburg.  However,  the infundibulum was identified grasped and retracted laterally, exposing the peritoneum overlying the triangle of Calot. This was then opened and dissected using cautery & scissors.  I utilized the suction irrigator as well to maintain better visualization in the midst of the edematous oozing.  An extended critical view of the cystic duct and cystic artery was obtained,  aided by the ICG via FireFly.    The cystic duct was clearly identified and  dissected to isolation.   Artery well isolated and clipped, and the cystic duct was triple clipped and divided with scissors, as close to the gallbladder neck as feasible, thus leaving two on the remaining stump.  The specimen side of the artery is sealed with bipolar and divided with monopolar scissors.   The gallbladder was taken from the gallbladder fossa in a retrograde fashion with the electrocautery.'s during this process were identified at the interface between the gallbladder and the fossa was somewhat liquefied with gangrene.  The gallbladder was fully removed and placed in an Endocatch bag.  The liver bed is inspected. Hemostasis was confirmed.  The robot was undocked and moved away from the operative field. Copious irrigation was utilized and was aspirated clear.  The gallbladder and Endocatch sac were then removed through the infraumbilical port site.   Inspection of the right upper quadrant was performed. No bleeding, bile duct injury or leak, or bowel injury was noted.  A 19 Blake drain was placed in the gallbladder fossa and withdrawn out the right lateral incision and secured with 3-0 nylon.  The infra-umbilical port site fascia was closed with interrumpted 0 Vicryl sutures using PMI/cone under direct visualization. Pneumoperitoneum was released and ports removed.  4-0 subcuticular Monocryl was used to close the skin. Dermabond was  applied.  The patient was then extubated and brought to the recovery room in stable condition. Sponge, lap, and needle counts were correct at closure and at the conclusion of the case.               [name], M.D., [facility] 03/20/2022 12:47 PM

## 2022-03-20 NOTE — Anesthesia Procedure Notes (Signed)
Procedure Name: Intubation Date/Time: 03/20/2022 10:22 AM  Performed by: Loletha Grayer, CRNAPre-anesthesia Checklist: Patient identified, Patient being monitored, Timeout performed, Emergency Drugs available and Suction available Patient Re-evaluated:Patient Re-evaluated prior to induction Oxygen Delivery Method: Circle system utilized Preoxygenation: Pre-oxygenation with 100% oxygen Induction Type: IV induction Laryngoscope Size: McGraph and 4 Grade View: Grade I Tube type: Oral Tube size: 7.5 mm Number of attempts: 1 Airway Equipment and Method: Stylet Placement Confirmation: ETT inserted through vocal cords under direct vision, positive ETCO2 and breath sounds checked- equal and bilateral Secured at: 23 cm Tube secured with: Tape Dental Injury: Teeth and Oropharynx as per pre-operative assessment

## 2022-03-20 NOTE — Transfer of Care (Addendum)
Immediate Anesthesia Transfer of Care Note  Patient: Edwin Ruiz  Procedure(s) Performed: XI ROBOTIC ASSISTED LAPAROSCOPIC CHOLECYSTECTOMY (Abdomen) INDOCYANINE GREEN FLUORESCENCE IMAGING (ICG) (Abdomen)  Patient Location: PACU  Anesthesia Type:General  Level of Consciousness: awake  Airway & Oxygen Therapy: Patient Spontanous Breathing  Post-op Assessment: Report given to RN and Post -op Vital signs reviewed and stable  Post vital signs: Reviewed and stable  Last Vitals:  Vitals Value Taken Time  BP 151/70 03/20/22 1250  Temp 36.6 C 03/20/22 1250  Pulse 65 03/20/22 1252  Resp 19 03/20/22 1252  SpO2 100 % 03/20/22 1252  Vitals shown include unvalidated device data.  Last Pain:  Vitals:   03/20/22 1250  TempSrc:   PainSc: (P) Asleep      Patients Stated Pain Goal: 0 (25/91/02 8902)  Complications: No notable events documented.

## 2022-03-20 NOTE — Anesthesia Postprocedure Evaluation (Signed)
Anesthesia Post Note  Patient: Edwin Ruiz  Procedure(s) Performed: XI ROBOTIC ASSISTED LAPAROSCOPIC CHOLECYSTECTOMY (Abdomen) INDOCYANINE GREEN FLUORESCENCE IMAGING (ICG) (Abdomen)  Patient location during evaluation: PACU Anesthesia Type: General Level of consciousness: awake and alert Pain management: pain level controlled Vital Signs Assessment: post-procedure vital signs reviewed and stable Respiratory status: spontaneous breathing, nonlabored ventilation, respiratory function stable and patient connected to nasal cannula oxygen Cardiovascular status: blood pressure returned to baseline and stable Postop Assessment: no apparent nausea or vomiting Anesthetic complications: no   No notable events documented.   Last Vitals:  Vitals:   03/20/22 1345 03/20/22 1400  BP: (!) 160/79 (!) 159/75  Pulse: 65 (!) 59  Resp: 19 11  Temp:    SpO2: 96% 94%    Last Pain:  Vitals:   03/20/22 1354  TempSrc:   PainSc: 4                  [name]

## 2022-03-20 NOTE — Anesthesia Preprocedure Evaluation (Signed)
Anesthesia Evaluation  Patient identified by MRN, date of birth, ID band Patient awake    Reviewed: Allergy & Precautions, H&P , NPO status , Patient's Chart, lab work & pertinent test results, reviewed documented beta blocker date and time   Airway Mallampati: III  TM Distance: >3 FB Neck ROM: full    Dental  (+) Teeth Intact   Pulmonary neg pulmonary ROS, asthma , sleep apnea and Continuous Positive Airway Pressure Ventilation , former smoker,    Pulmonary exam normal        Cardiovascular Exercise Tolerance: Poor hypertension, On Medications negative cardio ROS Normal cardiovascular exam+ pacemaker  Rhythm:regular Rate:Normal     Neuro/Psych negative neurological ROS  negative psych ROS   GI/Hepatic Neg liver ROS, GERD  Medicated,  Endo/Other  negative endocrine ROS  Renal/GU negative Renal ROS  negative genitourinary   Musculoskeletal   Abdominal   Peds  Hematology negative hematology ROS (+)   Anesthesia Other Findings Past Medical History: No date: Arthritis No date: Asthma No date: GERD (gastroesophageal reflux disease) No date: Hematuria No date: Hypertension No date: Presence of permanent cardiac pacemaker No date: Sleep apnea No date: Urticaria Past Surgical History: No date: APPENDECTOMY 02/05/2016: COLONOSCOPY WITH PROPOFOL; N/A     Comment:  Procedure: COLONOSCOPY WITH PROPOFOL;  Surgeon: Lollie Sails, MD;  Location: [facility] ENDOSCOPY;  Service:               Endoscopy;  Laterality: N/A; No date: FOOT SURGERY No date: INSERT / REPLACE / REMOVE PACEMAKER 01/16/2022: PPM GENERATOR CHANGEOUT; N/A     Comment:  Procedure: PPM GENERATOR CHANGEOUT;  Surgeon: Isaias Cowman, MD;  Location: [place] CV LAB;  Service:              Cardiovascular;  Laterality: N/A; No date: TONSILLECTOMY BMI    Body Mass Index: 46.26 kg/m     Reproductive/Obstetrics negative  OB ROS                             Anesthesia Physical Anesthesia Plan  ASA: 3  Anesthesia Plan: General ETT   Post-op Pain Management:    Induction:   PONV Risk Score and Plan:   Airway Management Planned:   Additional Equipment:   Intra-op Plan:   Post-operative Plan:   Informed Consent: I have reviewed the patients History and Physical, chart, labs and discussed the procedure including the risks, benefits and alternatives for the proposed anesthesia with the patient or authorized representative who has indicated his/her understanding and acceptance.     Dental Advisory Given  Plan Discussed with: CRNA  Anesthesia Plan Comments:         Anesthesia Quick Evaluation

## 2022-03-20 NOTE — Progress Notes (Signed)
End of shift note: 1630-1900  Pt admitted to the floor after having a lap chole and JP drain placement. VSS. IVF and IV antibiotics were started. Pt tolerating PO intake. Surgical sites are clean,dry and intact.

## 2022-03-20 NOTE — Interval H&P Note (Signed)
History and Physical Interval Note:  03/20/2022 9:51 AM  Edwin Ruiz  has presented today for surgery, with the diagnosis of chronic calculous cholecystitis.  The various methods of treatment have been discussed with the patient and family. After consideration of risks, benefits and other options for treatment, the patient has consented to  Procedure(s): XI ROBOTIC ASSISTED LAPAROSCOPIC CHOLECYSTECTOMY (N/A) [place] (ICG) (N/A) as a surgical intervention.  The patient's history has been reviewed, patient examined, no change in status, stable for surgery.  I have reviewed the patient's chart and labs.  Questions were answered to the patient's satisfaction.     [name]

## 2022-03-21 DIAGNOSIS — K8012 Calculus of gallbladder with acute and chronic cholecystitis without obstruction: Secondary | ICD-10-CM | POA: Diagnosis not present

## 2022-03-21 LAB — COMPREHENSIVE METABOLIC PANEL
ALT: 20 U/L (ref 0–44)
AST: 38 U/L (ref 15–41)
Albumin: 3.1 g/dL — ABNORMAL LOW (ref 3.5–5.0)
Alkaline Phosphatase: 59 U/L (ref 38–126)
Anion gap: 5 (ref 5–15)
BUN: 14 mg/dL (ref 8–23)
CO2: 28 mmol/L (ref 22–32)
Calcium: 8.7 mg/dL — ABNORMAL LOW (ref 8.9–10.3)
Chloride: 105 mmol/L (ref 98–111)
Creatinine, Ser: 1.13 mg/dL (ref 0.61–1.24)
GFR, Estimated: >60 mL/min (ref >60)
Glucose, Bld: 145 mg/dL — ABNORMAL HIGH (ref 70–99)
Potassium: 4.3 mmol/L (ref 3.5–5.1)
Sodium: 138 mmol/L (ref 135–145)
Total Bilirubin: 0.8 mg/dL (ref 0.3–1.2)
Total Protein: 6.8 g/dL (ref 6.5–8.1)

## 2022-03-21 LAB — SURGICAL PATHOLOGY

## 2022-03-21 LAB — CBC
HCT: 34 % — ABNORMAL LOW (ref 39.0–52.0)
Hemoglobin: 11.5 g/dL — ABNORMAL LOW (ref 13.0–17.0)
MCH: 32.2 pg (ref 26.0–34.0)
MCHC: 33.8 g/dL (ref 30.0–36.0)
MCV: 95.2 fL (ref 80.0–100.0)
Platelets: 250 10*3/uL (ref 150–400)
RBC: 3.57 MIL/uL — ABNORMAL LOW (ref 4.22–5.81)
RDW: 13.2 % (ref 11.5–15.5)
WBC: 10.3 10*3/uL (ref 4.0–10.5)
nRBC: 0 % (ref 0.0–0.2)

## 2022-03-21 MED ORDER — HYDROCODONE-ACETAMINOPHEN 5-325 MG PO TABS: 1.0000 | ORAL_TABLET | Freq: Four times a day (QID) | ORAL | 0 refills | Status: DC | PRN

## 2022-03-21 MED ORDER — POLYETHYLENE GLYCOL 3350 17 G PO PACK
17.0000 g | PACK | Freq: Every day | ORAL | Status: DC
  Administered 2022-03-21: 17 g via ORAL
  Filled 2022-03-21: qty 1

## 2022-03-21 MED ORDER — IBUPROFEN 600 MG PO TABS: 600.0000 mg | ORAL_TABLET | Freq: Four times a day (QID) | ORAL | 0 refills | Status: DC | PRN

## 2022-03-21 MED ORDER — DOCUSATE SODIUM 100 MG PO CAPS
100.0000 mg | ORAL_CAPSULE | Freq: Every day | ORAL | Status: DC
  Administered 2022-03-21: 100 mg via ORAL
  Filled 2022-03-21: qty 1

## 2022-03-21 MED ORDER — ACETAMINOPHEN 325 MG PO TABS
650.0000 mg | ORAL_TABLET | Freq: Four times a day (QID) | ORAL | Status: DC | PRN
  Administered 2022-03-21: 650 mg via ORAL
  Filled 2022-03-21: qty 2

## 2022-03-21 MED ORDER — CIPROFLOXACIN HCL 500 MG PO TABS: 500.0000 mg | ORAL_TABLET | Freq: Two times a day (BID) | ORAL | 0 refills | Status: AC

## 2022-03-21 MED ORDER — METRONIDAZOLE 500 MG PO TABS: 500.0000 mg | ORAL_TABLET | Freq: Three times a day (TID) | ORAL | 0 refills | Status: AC

## 2022-03-21 NOTE — Plan of Care (Signed)
  Problem: Clinical Measurements: Goal: Will remain free from infection Outcome: Not Progressing   Problem: Elimination: Goal: Will not experience complications related to bowel motility Outcome: Not Progressing   Problem: Pain Managment: Goal: General experience of comfort will improve Outcome: Not Progressing

## 2022-03-21 NOTE — Progress Notes (Signed)
Mobility Specialist - Progress Note    03/21/22 0900  Mobility  Activity Ambulated independently in hallway;Ambulated independently to bathroom;Stood at bedside;Dangled on edge of bed  Level of Assistance Independent after set-up  Assistive Device Front wheel walker  Distance Ambulated (ft) 100 ft  Activity Response Tolerated well  $Mobility charge 1 Mobility   Pt semi-supine in bed on RA upon arrival. Pt STS and ambulates to restroom and in hallway indep. Pt returns to recliner with needs in reach and family in room.   Gretchen Short  Mobility Specialist  03/21/22 9:30 AM

## 2022-03-21 NOTE — Discharge Summary (Signed)
[facility] SURGICAL DISCHARGE SUMMARY  Patient ID: Edwin Ruiz MRN: [id number] DOB/AGE: November 25, 1955 66 y.o.  Admit date: 03/20/2022 Discharge date: 03/21/2022  Discharge Diagnoses Patient Active Problem List   Diagnosis Date Noted   Acute cholecystitis due to biliary calculus 03/20/2022   CCC (chronic calculous cholecystitis) 03/19/2022    Consultants None  Procedures 03/20/2022:  Robotic assisted laparoscopic cholecystectomy   HPI: Edwin Ruiz is a 66 y.o. male with history of [place] who presents to [facility] on 03/20/2022 for scheduled cholecystectomy with Dr Synthia Innocent Course: Informed consent was obtained and documented, and patient underwent robotic assisted laparoscopic cholecystectomy (Dr Christian Mate, 03/20/2022).  Post-operatively, patient's did well. Advancement of patient's diet and ambulation were well-tolerated. The remainder of patient's hospital course was essentially unremarkable, and discharge planning was initiated accordingly with patient safely able to be discharged home with appropriate discharge instructions, antibiotics (Cipro & Flagyl x7 days), pain control, and outpatient follow-up after all of his questions were answered to his expressed satisfaction.   Discharge Condition: Good   Physical Examination:  Constitutional: Well appearing male, NAD Pulmonary: No respiratory distress; normal effort Gastrointestinal: Soft, incisional soreness, non-distended, no rebound/guarding. Surgical drain in right lateral port site; output serosanguinous Skin: Laparoscopic incisions are CDI with dermabond, no erythema or drainage    Allergies as of 03/21/2022       Reactions   Penicillins         Medication List     TAKE these medications    acetaminophen 500 MG tablet Commonly known as: TYLENOL Take 500 mg by mouth every 6 (six) hours as needed.   ciprofloxacin 500 MG tablet Commonly known as: Cipro Take 1 tablet (500 mg total) by  mouth 2 (two) times daily for 7 days.   HYDROcodone-acetaminophen 5-325 MG tablet Commonly known as: NORCO/VICODIN Take 1 tablet by mouth every 6 (six) hours as needed for moderate pain or severe pain.   ibuprofen 600 MG tablet Commonly known as: ADVIL Take 1 tablet (600 mg total) by mouth every 6 (six) hours as needed.   losartan 100 MG tablet Commonly known as: COZAAR Take 100 mg by mouth daily.   meloxicam 15 MG tablet Commonly known as: MOBIC Take 15 mg by mouth daily.   metroNIDAZOLE 500 MG tablet Commonly known as: Flagyl Take 1 tablet (500 mg total) by mouth 3 (three) times daily for 7 days.   omeprazole 20 MG capsule Commonly known as: PRILOSEC Take 20 mg by mouth daily.   tadalafil 20 MG tablet Commonly known as: CIALIS Take 20 mg by mouth daily as needed for erectile dysfunction.          Follow-up Information     [name], MD. Schedule an appointment as soon as possible for a visit on 03/26/2022.   Specialty: General Surgery Why: s/p laparoscopic cholecystectomy, has drain....can double book for Zach's schedule if needed Contact information: [address] [place] [address] [phone]                  Time spent on discharge management including discussion of hospital course, clinical condition, outpatient instructions, prescriptions, and follow up with the patient and members of the medical team: >30 minutes  -- Edison Simon , PA-C [facility]  03/21/2022, 9:09 AM [phone] M-F: 7am - 4pm

## 2022-03-21 NOTE — Progress Notes (Signed)
Patient ID: Edwin Ruiz, male   DOB: [birth year], 66 y.o.   MRN: [id number]  An After Visit Summary was printed and given to the patient.   Patient education given on medication changes, follow up appointments, wound/site care, JP drain care, and lifting restrictions and the patient expresses understanding and acceptance of instructions.   Wife to drive home.  Haydee Salter 03/21/2022 2:15 PM

## 2022-03-21 NOTE — TOC Initial Note (Signed)
Transition of Care [facility]) - Initial/Assessment Note    Patient Details  Name: Edwin Ruiz MRN: [id number] Date of Birth: 04-23-1956  Transition of Care [facility]) CM/SW Contact:    Beverly Sessions, RN Phone Number: 03/21/2022, 10:35 AM  Clinical Narrative:                     Transition of Care [facility]) Screening Note   Patient Details  Name: Edwin Ruiz Date of Birth: [birth year]   Transition of Care [facility]) CM/SW Contact:    Beverly Sessions, RN Phone Number: 03/21/2022, 10:35 AM    Transition of Care Department [facility]) has reviewed patient and no TOC needs have been identified at this time. We will continue to monitor patient advancement through interdisciplinary progression rounds. If new patient transition needs arise, please place a TOC consult.       Patient Goals and CMS Choice        Expected Discharge Plan and Services           Expected Discharge Date: 03/21/22                                    Prior Living Arrangements/Services                       Activities of Daily Living Home Assistive Devices/Equipment: Eyeglasses ADL Screening (condition at time of admission) Patient's cognitive ability adequate to safely complete daily activities?: Yes Is the patient deaf or have difficulty hearing?: No Does the patient have difficulty seeing, even when wearing glasses/contacts?: No Does the patient have difficulty concentrating, remembering, or making decisions?: No Patient able to express need for assistance with ADLs?: Yes Does the patient have difficulty dressing or bathing?: No Independently performs ADLs?: Yes (appropriate for developmental age) Does the patient have difficulty walking or climbing stairs?: No Weakness of Legs: Both Weakness of Arms/Hands: None  Permission Sought/Granted                  Emotional Assessment              Admission diagnosis:  Acute cholecystitis due to biliary calculus  [K80.00] Patient Active Problem List   Diagnosis Date Noted   Acute cholecystitis due to biliary calculus 03/20/2022   Aneurysm of ascending aorta without rupture ([place]) 03/19/2022   CCC (chronic calculous cholecystitis) 03/19/2022   Solitary pulmonary nodule 03/19/2022   PCP:  Maryland Pink, MD Pharmacy:   [id number] [place] [address]Phone: [phone]: [phone]    Social Determinants of Health (SDOH) Interventions    Readmission Risk Interventions     No data to display

## 2022-03-21 NOTE — Discharge Instructions (Signed)
In addition to included general post-operative instructions,  Diet: Resume home diet. Recommend avoiding or limiting fatty/greasy foods over the next few days/week. If you do eat these, you may (or may not) notice diarrhea. This is expected while your body adjusts to not having a gallbladder, and it typically resolves with time.    Activity: No heavy lifting >20 pounds (children, pets, laundry, garbage) or strenuous activity for 4 weeks, but light activity and walking are encouraged. Do not drive or drink alcohol if taking narcotic pain medications or having pain that might distract from driving.  Wound care: If you can keep drain site water proofed, 2 days after surgery (08/25), you may shower/get incision wet with soapy water and pat dry (do not rub incisions), but no baths or submerging incision underwater until follow-up. If not, recommend sponge bathing until drain is out.   Drain: Monitor and record drain output daily; hand out given for this. Please bring this to your follow up appointment.   Medications: Complete antibiotics as prescribed; do not stop early. Resume all home medications. For mild to moderate pain: acetaminophen (Tylenol) or ibuprofen/naproxen (if no kidney disease). Combining Tylenol with alcohol can substantially increase your risk of causing liver disease. Narcotic pain medications, if prescribed, can be used for severe pain, though may cause nausea, constipation, and drowsiness. Do not combine Tylenol and Percocet (or similar) within a 6 hour period as Percocet (and similar) contain(s) Tylenol. If you do not need the narcotic pain medication, you do not need to fill the prescription.  Call office [phone]) at any time if any questions, worsening pain, fevers/chills, bleeding, drainage from incision site, or other concerns.

## 2022-03-25 ENCOUNTER — Telehealth: Payer: Self-pay

## 2022-03-25 NOTE — Telephone Encounter (Signed)
Laparoscopic Cholecystectomy 03/20/22 Dr.Rodenberg-he snagged the tubing on something and it looks like the suture is gone- a little leakage.but not bad.

## 2022-03-26 ENCOUNTER — Encounter: Payer: Self-pay | Admitting: Physician Assistant

## 2022-03-26 ENCOUNTER — Ambulatory Visit (INDEPENDENT_AMBULATORY_CARE_PROVIDER_SITE_OTHER): Payer: Medicare PPO | Admitting: Physician Assistant

## 2022-03-26 VITALS — BP 144/76 | HR 81 | Temp 98.5°F | Ht 76.0 in | Wt 387.0 lb

## 2022-03-26 DIAGNOSIS — K8000 Calculus of gallbladder with acute cholecystitis without obstruction: Secondary | ICD-10-CM

## 2022-03-26 DIAGNOSIS — K8010 Calculus of gallbladder with chronic cholecystitis without obstruction: Secondary | ICD-10-CM

## 2022-03-26 DIAGNOSIS — Z09 Encounter for follow-up examination after completed treatment for conditions other than malignant neoplasm: Secondary | ICD-10-CM

## 2022-03-26 NOTE — Progress Notes (Signed)
[facility] POST-OP OFFICE VISIT  03/26/2022  HPI: Edwin Ruiz is a 66 y.o. male 6 days s/p robotic assisted laparoscopic cholecystectomy for acute cholecystitis with Dr Christian Mate   He is doing very well Worried about snagging his drain on stove this weekend; output serous; <20 ccs daily He has had no issues with abdominal pain No fever, chills, nausea, emesis, significant diarrhea No issues with incisions   Vital signs: BP (!) 144/76   Pulse 81   Temp 98.5 F (36.9 C)   Ht '6\' 4"'$  (1.93 m)   Wt (!) 387 lb (175.5 kg)   SpO2 98%   BMI 47.11 kg/m    Physical Exam: Constitutional: Well appearing male, NAD Abdomen: Soft, non-tender, non-distended, no rebound/guarding. Surgical drain in right abdomen; output serous (Removed) Skin: Laparoscopic incisions are healing well; still with dermabond, no erythema or drainage   Assessment/Plan: This is a 66 y.o. male 6 days s/p robotic assisted laparoscopic cholecystectomy for acute cholecystitis with Dr Christian Mate    - Drain removed; occlusive dressing placed  - Pain control prn  - Reviewed wound care recommendation  - Reviewed lifting restrictions; 4 weeks total  - Reviewed surgical pathology; Acute on chronic cholecystitis; neg for malignancy   - I did offer follow up but he is doing so well and wishes to follow up as needed, which is reasonable; He and his wife understand to call with questions/concerns  -- Edison Simon, PA-C [facility] 03/26/2022, 1:38 PM M-F: 7am - 4pm

## 2022-03-26 NOTE — Patient Instructions (Addendum)
You may shower, do not scrub at the wounds.  Finish all of your antibiotics.   You may remove your dressing in 2 days. If needed you can place a band aid over this.  No swimming or taking a bath for one more week.    Follow-up with our office as needed.  Please call and ask to speak with a nurse if you develop questions or concerns.   GENERAL POST-OPERATIVE PATIENT INSTRUCTIONS   WOUND CARE INSTRUCTIONS:  Try to keep the wound dry and avoid ointments on the wound unless directed to do so.  If the wound becomes bright red and painful or starts to drain infected material that is not clear, please contact your physician immediately.  If the wound is mildly pink and has a thick firm ridge underneath it, this is normal, and is referred to as a healing ridge.  This will resolve over the next 4-6 weeks.  BATHING: You may shower if you have been informed of this by your surgeon. However, Please do not submerge in a tub, hot tub, or pool until incisions are completely sealed or have been told by your surgeon that you may do so.  DIET:  You may eat any foods that you can tolerate.  It is a good idea to eat a high fiber diet and take in plenty of fluids to prevent constipation.  If you do become constipated you may want to take a mild laxative or take ducolax tablets on a daily basis until your bowel habits are regular.  Constipation can be very uncomfortable, along with straining, after recent surgery.  ACTIVITY: You are encouraged to walk and engage in light activity for the next two weeks.  You should not lift more than 20 pounds for 6 weeks total after surgery as it could put you at increased risk for complications.  Twenty pounds is roughly equivalent to a plastic bag of groceries. At that time- Listen to your body when lifting, if you have pain when lifting, stop and then try again in a few days. Soreness after doing exercises or activities of daily living is normal as you get back in to your normal  routine.  MEDICATIONS:  Try to take narcotic medications and anti-inflammatory medications, such as tylenol, ibuprofen, naprosyn, etc., with food.  This will minimize stomach upset from the medication.  Should you develop nausea and vomiting from the pain medication, or develop a rash, please discontinue the medication and contact your physician.  You should not drive, make important decisions, or operate machinery when taking narcotic pain medication.  SUNBLOCK Use sun block to incision area over the next year if this area will be exposed to sun. This helps decrease scarring and will allow you avoid a permanent darkened area over your incision.  QUESTIONS:  Please feel free to call our office if you have any questions, and we will be glad to assist you. [phone]

## 2022-03-28 ENCOUNTER — Encounter: Payer: Medicare PPO | Admitting: Physician Assistant

## 2022-03-29 ENCOUNTER — Ambulatory Visit: Payer: Medicare PPO | Admitting: Pulmonary Disease

## 2022-03-29 ENCOUNTER — Encounter: Payer: Self-pay | Admitting: Pulmonary Disease

## 2022-03-29 VITALS — BP 134/80 | HR 76 | Ht 76.0 in | Wt 394.4 lb

## 2022-03-29 DIAGNOSIS — Z87891 Personal history of nicotine dependence: Secondary | ICD-10-CM | POA: Diagnosis not present

## 2022-03-29 DIAGNOSIS — I712 Thoracic aortic aneurysm, without rupture, unspecified: Secondary | ICD-10-CM

## 2022-03-29 DIAGNOSIS — R911 Solitary pulmonary nodule: Secondary | ICD-10-CM | POA: Diagnosis not present

## 2022-03-29 NOTE — Patient Instructions (Signed)
Thank you for visiting Dr. Valeta Harms at [facility]. Today we recommend the following:  Orders Placed This Encounter  Procedures   CT Chest Wo Contrast   Ambulatory referral to Vascular Surgery   Return in about 1 year (around 03/30/2023) for with Eric Form, NP, or Dr. Valeta Harms.    Please do your part to reduce the spread of COVID-19.

## 2022-03-29 NOTE — Progress Notes (Signed)
Synopsis: Referred in Aug 2023 for lung nodules by Lucillie Garfinkel, MD  Subjective:   PATIENT ID: Edwin Ruiz GENDER: male DOB: [birth year], MRN: [id number]  Chief Complaint  Patient presents with   Consult    Lung nodule.    This is a 66 year old gentleman, past medical history of GERD, hypertension, obesity.  Patient had abdominal pain was seen in the emergency department had a CT chest abdomen pelvis completed.  Patient's father had a history of an abdominal AAA.  Patient was found to have an incidental right middle lobe 5 mm pulmonary nodule.  He has a history of tobacco use.  He is a former smoker.    Past Medical History:  Diagnosis Date   Arthritis    Asthma    GERD (gastroesophageal reflux disease)    Hematuria    Hypertension    Presence of permanent cardiac pacemaker    Sleep apnea    Urticaria      No family history on file.   Past Surgical History:  Procedure Laterality Date   APPENDECTOMY     COLONOSCOPY WITH PROPOFOL N/A 02/05/2016   Procedure: COLONOSCOPY WITH PROPOFOL;  Surgeon: Lollie Sails, MD;  Location: [facility] ENDOSCOPY;  Service: Endoscopy;  Laterality: N/A;   FOOT SURGERY     INSERT / REPLACE / REMOVE PACEMAKER     PPM GENERATOR CHANGEOUT N/A 01/16/2022   Procedure: PPM GENERATOR CHANGEOUT;  Surgeon: Isaias Cowman, MD;  Location: [facility];  Service: Cardiovascular;  Laterality: N/A;   TONSILLECTOMY      Social History   Socioeconomic History   Marital status: Married    Spouse name: Not on file   Number of children: Not on file   Years of education: Not on file   Highest education level: Not on file  Occupational History   Not on file  Tobacco Use   Smoking status: Former    Passive exposure: Past   Smokeless tobacco: Never  Substance and Sexual Activity   Alcohol use: Yes    Comment: occasional   Drug use: No   Sexual activity: Not on file  Other Topics Concern   Not on file  Social History Narrative   Not on  file   Social Determinants of Health   Financial Resource Strain: Not on file  Food Insecurity: Not on file  Transportation Needs: Not on file  Physical Activity: Not on file  Stress: Not on file  Social Connections: Not on file  Intimate Partner Violence: Not on file     Allergies  Allergen Reactions   Penicillins      Outpatient Medications Prior to Visit  Medication Sig Dispense Refill   acetaminophen (TYLENOL) 500 MG tablet Take 500 mg by mouth every 6 (six) hours as needed.     HYDROcodone-acetaminophen (NORCO/VICODIN) 5-325 MG tablet Take 1 tablet by mouth every 6 (six) hours as needed for moderate pain or severe pain. 20 tablet 0   ibuprofen (ADVIL) 600 MG tablet Take 1 tablet (600 mg total) by mouth every 6 (six) hours as needed. 30 tablet 0   losartan (COZAAR) 100 MG tablet Take 100 mg by mouth daily.     meloxicam (MOBIC) 15 MG tablet Take 15 mg by mouth daily.     omeprazole (PRILOSEC) 20 MG capsule Take 20 mg by mouth daily.     tadalafil (CIALIS) 20 MG tablet Take 20 mg by mouth daily as needed for erectile dysfunction.  No facility-administered medications prior to visit.    Review of Systems  Constitutional:  Negative for chills, fever, malaise/fatigue and weight loss.  HENT:  Negative for hearing loss, sore throat and tinnitus.   Eyes:  Negative for blurred vision and double vision.  Respiratory:  Negative for cough, hemoptysis, sputum production, shortness of breath, wheezing and stridor.   Cardiovascular:  Negative for chest pain, palpitations, orthopnea, leg swelling and PND.  Gastrointestinal:  Negative for abdominal pain, constipation, diarrhea, heartburn, nausea and vomiting.  Genitourinary:  Negative for dysuria, hematuria and urgency.  Musculoskeletal:  Negative for joint pain and myalgias.  Skin:  Negative for itching and rash.  Neurological:  Negative for dizziness, tingling, weakness and headaches.  Endo/Heme/Allergies:  Negative for  environmental allergies. Does not bruise/bleed easily.  Psychiatric/Behavioral:  Negative for depression. The patient is not nervous/anxious and does not have insomnia.   All other systems reviewed and are negative.    Objective:  Physical Exam Vitals reviewed.  Constitutional:      General: He is not in acute distress.    Appearance: He is well-developed.  HENT:     Head: Normocephalic and atraumatic.  Eyes:     General: No scleral icterus.    Conjunctiva/sclera: Conjunctivae normal.     Pupils: Pupils are equal, round, and reactive to light.  Neck:     Vascular: No JVD.     Trachea: No tracheal deviation.  Cardiovascular:     Rate and Rhythm: Normal rate and regular rhythm.     Heart sounds: Normal heart sounds. No murmur heard. Pulmonary:     Effort: Pulmonary effort is normal. No tachypnea, accessory muscle usage or respiratory distress.     Breath sounds: Normal breath sounds. No stridor. No wheezing, rhonchi or rales.  Abdominal:     General: There is no distension.     Palpations: Abdomen is soft.     Tenderness: There is no abdominal tenderness.     Comments: Obese abdomen  Musculoskeletal:        General: No tenderness.     Cervical back: Neck supple.     Right lower leg: Edema present.     Left lower leg: Edema present.     Comments: Varicose veins  Lymphadenopathy:     Cervical: No cervical adenopathy.  Skin:    General: Skin is warm and dry.     Capillary Refill: Capillary refill takes less than 2 seconds.     Findings: No rash.  Neurological:     Mental Status: He is alert and oriented to person, place, and time.  Psychiatric:        Behavior: Behavior normal.      Vitals:   03/29/22 0913  BP: 134/80  Pulse: 76  SpO2: 97%  Weight: (!) 394 lb 6.4 oz (178.9 kg)  Height: '6\' 4"'$  (1.93 m)   97% on RA BMI Readings from Last 3 Encounters:  03/29/22 48.01 kg/m  03/26/22 47.11 kg/m  03/20/22 46.26 kg/m   Wt Readings from Last 3 Encounters:   03/29/22 (!) 394 lb 6.4 oz (178.9 kg)  03/26/22 (!) 387 lb (175.5 kg)  03/20/22 (!) 380 lb (172.4 kg)     CBC    Component Value Date/Time   WBC 10.3 03/21/2022 0635   RBC 3.57 (L) 03/21/2022 0635   HGB 11.5 (L) 03/21/2022 0635   HGB 13.2 02/09/2014 1403   HCT 34.0 (L) 03/21/2022 0635   HCT 37.7 (L) 02/09/2014 1403  PLT 250 03/21/2022 0635   PLT 186 02/09/2014 1403   MCV 95.2 03/21/2022 0635   MCV 91 02/09/2014 1403   MCH 32.2 03/21/2022 0635   MCHC 33.8 03/21/2022 0635   RDW 13.2 03/21/2022 0635   RDW 14.5 02/09/2014 1403   LYMPHSABS 1.6 02/09/2014 1403   MONOABS 0.4 02/09/2014 1403   EOSABS 0.1 02/09/2014 1403   BASOSABS 0.0 02/09/2014 1403     Chest Imaging:  CTA chest 5 mm right middle lobe pulmonary nodule. The patient's images have been independently reviewed by me.    Pulmonary Functions Testing Results:     No data to display          FeNO:   Pathology:   Echocardiogram:   Heart Catheterization:     Assessment & Plan:     ICD-10-CM   1. Lung nodule  R91.1 CT Chest Wo Contrast    2. Thoracic aortic aneurysm without rupture, unspecified part ([place])  I71.20 Ambulatory referral to Vascular Surgery    3. Former smoker  Z87.891       Discussion:  This is a 66 year old gentleman, former smoker, incidental found thoracic aneurysm as well as an incidentally found 5 mm right middle lobe pulmonary nodule.  Plan: Due to the patient's smoking history he needs a repeat noncontrasted CT chest in 1 year. We will refer to vascular and vein surgery for follow-up of his thoracic aneurysm. He also has varicose veins that they should be able to address. We appreciate the referral.  We will see the patient back in 1 year after his CT chest.    Current Outpatient Medications:    acetaminophen (TYLENOL) 500 MG tablet, Take 500 mg by mouth every 6 (six) hours as needed., Disp: , Rfl:    HYDROcodone-acetaminophen (NORCO/VICODIN) 5-325 MG tablet, Take 1  tablet by mouth every 6 (six) hours as needed for moderate pain or severe pain., Disp: 20 tablet, Rfl: 0   ibuprofen (ADVIL) 600 MG tablet, Take 1 tablet (600 mg total) by mouth every 6 (six) hours as needed., Disp: 30 tablet, Rfl: 0   losartan (COZAAR) 100 MG tablet, Take 100 mg by mouth daily., Disp: , Rfl:    meloxicam (MOBIC) 15 MG tablet, Take 15 mg by mouth daily., Disp: , Rfl:    omeprazole (PRILOSEC) 20 MG capsule, Take 20 mg by mouth daily., Disp: , Rfl:    tadalafil (CIALIS) 20 MG tablet, Take 20 mg by mouth daily as needed for erectile dysfunction., Disp: , Rfl:     Garner Nash, DO [facility] 03/29/2022 9:54 AM

## 2022-04-08 ENCOUNTER — Other Ambulatory Visit: Payer: Self-pay

## 2022-04-08 DIAGNOSIS — I712 Thoracic aortic aneurysm, without rupture, unspecified: Secondary | ICD-10-CM

## 2022-04-18 ENCOUNTER — Ambulatory Visit (INDEPENDENT_AMBULATORY_CARE_PROVIDER_SITE_OTHER): Payer: Medicare PPO | Admitting: Surgery

## 2022-04-18 ENCOUNTER — Other Ambulatory Visit
Admission: RE | Admit: 2022-04-18 | Discharge: 2022-04-18 | Disposition: A | Discharge status: Home / Self Care | Payer: Medicare PPO | Source: Ambulatory Visit | Attending: Surgery | Admitting: Surgery

## 2022-04-18 ENCOUNTER — Encounter: Payer: Self-pay | Admitting: Surgery

## 2022-04-18 VITALS — BP 136/88 | HR 86 | Temp 98.4°F | Wt 385.4 lb

## 2022-04-18 DIAGNOSIS — Z09 Encounter for follow-up examination after completed treatment for conditions other than malignant neoplasm: Secondary | ICD-10-CM

## 2022-04-18 DIAGNOSIS — R7989 Other specified abnormal findings of blood chemistry: Secondary | ICD-10-CM | POA: Diagnosis not present

## 2022-04-18 DIAGNOSIS — K8000 Calculus of gallbladder with acute cholecystitis without obstruction: Secondary | ICD-10-CM

## 2022-04-18 DIAGNOSIS — Z9049 Acquired absence of other specified parts of digestive tract: Secondary | ICD-10-CM | POA: Insufficient documentation

## 2022-04-18 LAB — COMPREHENSIVE METABOLIC PANEL
ALT: 11 U/L (ref 0–44)
AST: 16 U/L (ref 15–41)
Albumin: 3.8 g/dL (ref 3.5–5.0)
Alkaline Phosphatase: 64 U/L (ref 38–126)
Anion gap: 7 (ref 5–15)
BUN: 16 mg/dL (ref 8–23)
CO2: 25 mmol/L (ref 22–32)
Calcium: 9 mg/dL (ref 8.9–10.3)
Chloride: 107 mmol/L (ref 98–111)
Creatinine, Ser: 1.08 mg/dL (ref 0.61–1.24)
GFR, Estimated: >60 mL/min (ref >60)
Glucose, Bld: 102 mg/dL — ABNORMAL HIGH (ref 70–99)
Potassium: 4 mmol/L (ref 3.5–5.1)
Sodium: 139 mmol/L (ref 135–145)
Total Bilirubin: 0.8 mg/dL (ref 0.3–1.2)
Total Protein: 7 g/dL (ref 6.5–8.1)

## 2022-04-18 LAB — CBC
HCT: 35.9 % — ABNORMAL LOW (ref 39.0–52.0)
Hemoglobin: 12.5 g/dL — ABNORMAL LOW (ref 13.0–17.0)
MCH: 32.3 pg (ref 26.0–34.0)
MCHC: 34.8 g/dL (ref 30.0–36.0)
MCV: 92.8 fL (ref 80.0–100.0)
Platelets: 155 10*3/uL (ref 150–400)
RBC: 3.87 MIL/uL — ABNORMAL LOW (ref 4.22–5.81)
RDW: 13 % (ref 11.5–15.5)
WBC: 4.7 10*3/uL (ref 4.0–10.5)
nRBC: 0 % (ref 0.0–0.2)

## 2022-04-18 NOTE — Progress Notes (Signed)
[facility] POST-OP OFFICE VISIT  04/18/2022  HPI: Edwin Ruiz is a 66 y.o. male 4 weeks s/p robotic cholecystectomy.  He had acute on chronic cholecystitis with cholelithiasis.  Of recent he had a bit of an episode of diaphoresis and lower abdominal pain which came after overindulging a bit at a family reunion.  It seemed to spontaneously resolve on its own.  But it was quite alarming.  He usually moves his bowels daily, and this has not been interrupted.  He denies any type of steatorrhea, or other diarrhea.  He other than that episode he has had no nausea or vomiting.  He denies any jaundice or acholic stools.  Questions were raised if a retained stone is possible.  His common bile duct was not dilated prior to surgery, nor were his LFTs abnormal prior to surgery.  Vital signs: BP 136/88   Pulse 86   Temp 98.4 F (36.9 C) (Oral)   Wt (!) 385 lb 6.4 oz (174.8 kg)   SpO2 98%   BMI 46.91 kg/m    Physical Exam: Constitutional: He appears well today. Abdomen: Well rounded, obese.  All incisions are clean dry and intact.  No masses or evidence of hernia.  Drain site is clean. Skin: As above.  Assessment/Plan: This is a 66 y.o. male 4 weeks s/p robotic cholecystectomy.  Patient Active Problem List   Diagnosis Date Noted   Acute cholecystitis due to biliary calculus 03/20/2022   Aneurysm of ascending aorta without rupture ([place]) 03/19/2022   CCC (chronic calculous cholecystitis) 03/19/2022   Solitary pulmonary nodule 03/19/2022    -I believe he does need to continue follow-up with his primary care, we discussed fiber and normal bowel function activity with gastrocolic reflex.  Advised to be more judicious at these family gatherings.  We will obtain a CBC and CMP just to ensure.  Anticipate follow-up as needed.   [name] M.D., FACS 04/18/2022, 9:20 AM

## 2022-04-18 NOTE — Patient Instructions (Addendum)
Get your labs drawn at [facility].   If you have any concerns or questions, please feel free to call our office.     Gallbladder Eating Plan  High blood cholesterol, obesity, a sedentary lifestyle, an unhealthy diet, and diabetes are risk factors for developing gallstones. If you have a gallbladder condition, you may have trouble digesting fats and tolerating high fat intake. Eating a low-fat diet can help reduce your symptoms and may be helpful before and after having surgery to remove your gallbladder (cholecystectomy). Your health care provider may recommend that you work with a dietitian to help you reduce the amount of fat in your diet. What are tips for following this plan? General guidelines Limit your fat intake to less than 30% of your total daily calories. If you eat around 1,800 calories each day, this means eating less than 60 grams (g) of fat per day. Fat is an important part of a healthy diet. Eating a low-fat diet can make it hard to maintain a healthy body weight. Ask your dietitian how much fat, calories, and other nutrients you need each day. Eat small, frequent meals throughout the day instead of three large meals. Drink at least 8-10 cups (1.9-2.4 L) of fluid a day. Drink enough fluid to keep your urine pale yellow. If you drink alcohol: Limit how much you have to: 0-1 drink a day for women who are not pregnant. 0-2 drinks a day for men. Know how much alcohol is in a drink. In the [place] one drink equals one 12 oz bottle of beer (355 mL), one 5 oz glass of wine (148 mL), or one 1 oz glass of hard liquor (44 mL). Reading food labels  Check nutrition facts on food labels for the amount of fat per serving. Choose foods with less than 3 grams of fat per serving. Shopping Choose nonfat and low-fat healthy foods. Look for the words "nonfat," "low-fat," or "fat-free." Avoid buying processed or prepackaged foods. Cooking Cook using low-fat methods, such as baking,  broiling, grilling, or boiling. Cook with small amounts of healthy fats, such as olive oil, grapeseed oil, canola oil, avocado oil, or sunflower oil. What foods are recommended?  All fresh, frozen, or canned fruits and vegetables. Whole grains. Low-fat or nonfat (skim) milk and yogurt. Lean meat, skinless poultry, fish, eggs, and beans. Low-fat protein supplement powders or drinks. Spices and herbs. The items listed above may not be a complete list of foods and beverages you can eat and drink. Contact a dietitian for more information. What foods are not recommended? High-fat foods. These include baked goods, fast food, fatty cuts of meat, ice cream, french toast, sweet rolls, pizza, cheese bread, foods covered with butter, creamy sauces, or cheese. Fried foods. These include french fries, tempura, battered fish, breaded chicken, fried breads, and sweets. Foods that cause bloating and gas. The items listed above may not be a complete list of foods that you should avoid. Contact a dietitian for more information. Summary A low-fat diet can be helpful if you have a gallbladder condition, or before and after gallbladder surgery. Limit your fat intake to less than 30% of your total daily calories. This is about 60 g of fat if you eat 1,800 calories each day. Eat small, frequent meals throughout the day instead of three large meals. This information is not intended to replace advice given to you by your health care provider. Make sure you discuss any questions you have with your health care provider.  Document Revised: 06/29/2021 Document Reviewed: 06/29/2021 Elsevier Patient Education  [place].

## 2022-04-25 NOTE — Progress Notes (Addendum)
[address]       [place] [address]             [phone]         PCP is Maryland Pink, MD Referring Provider is Maryland Pink, MD  Chief Complaint: 4.5 cm ATAA   HPI: This is a 66 year old male with a past medical history of hypertension, PPM, sleep apnea, obesity, GERD, former tobacco abuse, varicose veins, asthma, arthritis, and right middle pulmonary nodule (5 mm, followed by Dr. Valeta Harms) who was found to have an incidental 4.5 cm ATAA on CTA 03/13/2022. Patient denies chest pain, pressure or shortness of breath. He sometimes has LE swelling as has varicose veins and sometimes wears compression stockings.  Past Medical History:  Diagnosis Date   Acute cholecystitis due to biliary calculus 03/20/2022   Arthritis    Asthma    GERD (gastroesophageal reflux disease)    Hematuria    Hypertension    Presence of permanent cardiac pacemaker    Sleep apnea    Urticaria     Past Surgical History:  Procedure Laterality Date   APPENDECTOMY     COLONOSCOPY WITH PROPOFOL N/A 02/05/2016   Procedure: COLONOSCOPY WITH PROPOFOL;  Surgeon: Lollie Sails, MD;  Location: [facility] ENDOSCOPY;  Service: Endoscopy;  Laterality: N/A;   FOOT SURGERY     INSERT / REPLACE / REMOVE PACEMAKER     PPM GENERATOR CHANGEOUT N/A 01/16/2022   Procedure: PPM GENERATOR CHANGEOUT;  Surgeon: Isaias Cowman, MD;  Location: [place] [facility];  Service: Cardiovascular;  Laterality: N/A;   TONSILLECTOMY    Robotic assisted lap cholecystectomy 03/20/2022  Family History: Mother deceased at age 60, Asian flu Father deceased at age 47, had AAA but did not die from this Grandparents had history of stroke  Social History Social History   Tobacco Use   Smoking status: Former-quit greater than 10 years ago    Passive exposure: Past   Smokeless tobacco: Never  Substance Use Topics   Alcohol use: Yes, occasional beer    Comment: occasional   Drug use: No  He is retired from  [employer], is married, and has 2 children  Current Outpatient Medications  Medication Sig Dispense Refill   acetaminophen (TYLENOL) 500 MG tablet Take 500 mg by mouth every 6 (six) hours as needed.     ibuprofen (ADVIL) 600 MG tablet Take 1 tablet (600 mg total) by mouth every 6 (six) hours as needed. 30 tablet 0   losartan (COZAAR) 100 MG tablet Take 100 mg by mouth daily.     meloxicam (MOBIC) 15 MG tablet Take 15 mg by mouth daily.     omeprazole (PRILOSEC) 20 MG capsule Take 20 mg by mouth daily.     tadalafil (CIALIS) 20 MG tablet Take 20 mg by mouth daily as needed for erectile dysfunction.      Allergies  Allergen Reactions   Penicillins     Review of Systems Hearing loss;otherwise, non contributory except as stated in HPI  Vital Signs:  Vitals:   04/30/22 1250  BP: 124/74  Pulse: 84  Resp: 18  SpO2: 95%     Physical Exam: CV-RRR, no murmur Pulmonary-Clear to auscultation bilaterally Abdomen-Soft, obese, non tender Extremities-Varicose veins, no LE edema Neurologic-Grossly intact without focal deficit   Diagnostic Tests: Narrative & Impression  CLINICAL DATA:  Acute aortic syndrome. Left side pain rating to the back and abdomen.   EXAM: CT  ANGIOGRAPHY CHEST, ABDOMEN AND PELVIS   TECHNIQUE: Non-contrast CT of the chest was initially obtained.   Multidetector CT imaging through the chest, abdomen and pelvis was performed using the standard protocol during bolus administration of intravenous contrast. Multiplanar reconstructed images and MIPs were obtained and reviewed to evaluate the vascular anatomy.   RADIATION DOSE REDUCTION: This exam was performed according to the departmental dose-optimization program which includes automated exposure control, adjustment of the mA and/or kV according to patient size and/or use of iterative reconstruction technique.   CONTRAST:  142m OMNIPAQUE IOHEXOL 350 MG/ML SOLN   COMPARISON:  Abdomen pelvis CT 03/04/2016.    FINDINGS: CTA CHEST FINDINGS   Cardiovascular: Pre contrast imaging shows no hyperdense crescent in the wall of the thoracic aorta to suggest the presence of an acute intramural hematoma. Heart size upper normal. No pericardial effusion. Ascending thoracic aorta measures 4.5 cm diameter. No evidence for dissection flap within the thoracic aorta. No large central pulmonary embolus in the pulmonary outflow tract or main pulmonary arteries.   Mediastinum/Nodes: No mediastinal lymphadenopathy. There is no hilar lymphadenopathy. The esophagus has normal imaging features. There is no axillary lymphadenopathy.   Lungs/Pleura: 5 mm right middle lobe pulmonary nodule identified on image 74/6. No focal airspace consolidation. No pleural effusion.   Musculoskeletal: No worrisome lytic or sclerotic osseous abnormality.   Review of the MIP images confirms the above findings.   CTA ABDOMEN AND PELVIS FINDINGS   VASCULAR   Aorta: Normal caliber aorta without aneurysm, dissection, vasculitis or significant stenosis.   Celiac: Patent without evidence of aneurysm, dissection, vasculitis or significant stenosis.   SMA: Patent without evidence of aneurysm, dissection, vasculitis or significant stenosis.   Renals: Both renal arteries are patent without evidence of aneurysm, dissection, vasculitis, fibromuscular dysplasia or significant stenosis.   IMA: Patent without evidence of aneurysm, dissection, vasculitis or significant stenosis.   Inflow: Patent without evidence of aneurysm, dissection, vasculitis or significant stenosis.   Veins: No obvious venous abnormality within the limitations of this arterial phase study.   Review of the MIP images confirms the above findings.   NON-VASCULAR   Hepatobiliary: Similar appearance left hepatic cyst. 10 mm calcified gallstone evident. No intrahepatic or extrahepatic biliary dilation.   Pancreas: No focal mass lesion. No dilatation of the  main duct. No intraparenchymal cyst. No peripancreatic edema.   Spleen: No splenomegaly. No focal mass lesion.   Adrenals/Urinary Tract: No adrenal nodule or mass. Interval progression of a 3.9 cm hypoattenuating lesion in the interpolar left kidney measuring water density, compatible with a cyst. Left kidney unremarkable. No evidence for hydroureter. The urinary bladder appears normal for the degree of distention.   Stomach/Bowel: Stomach is unremarkable. No gastric wall thickening. No evidence of outlet obstruction. Duodenum is normally positioned as is the ligament of Treitz. No small bowel wall thickening. No small bowel dilatation. The terminal ileum is normal. The appendix is not well visualized, but there is no edema or inflammation in the region of the cecum. No gross colonic mass. No colonic wall thickening.   Lymphatic: No abdominal lymphadenopathy. 14 mm short axis right common femoral lymph node is not substantially changed in the interval, consistent with reactive etiology.   Reproductive: The prostate gland and seminal vesicles are unremarkable.   Other: No intraperitoneal free fluid.   Musculoskeletal: Left groin hernia contains only fat. Tiny umbilical hernia contains only fat. No worrisome lytic or sclerotic osseous abnormality.   Review of the MIP images confirms the above  findings.   IMPRESSION: 1. No evidence for acute intramural hematoma or dissection in the thoracic or abdominal aorta. 2. 4.5 cm ascending thoracic aortic aneurysm. Recommend semi-annual imaging followup by CTA or MRA and referral to cardiothoracic surgery if not already obtained. This recommendation follows 2010 ACCF/AHA/AATS/ACR/ASA/SCA/SCAI/SIR/STS/SVM Guidelines for the Diagnosis and Management of Patients With Thoracic Aortic Disease. Circulation. 2010; 121: K354-S568. Aortic aneurysm NOS (ICD10-I71.9) 3. 5 mm right middle lobe pulmonary nodule. No follow-up needed if patient is  low-risk.This recommendation follows the consensus statement: Guidelines for Management of Incidental Pulmonary Nodules Detected on CT Images: From the Fleischner Society 2017; Radiology 2017; 284:228-243. 4. Cholelithiasis without evidence for cholecystitis. 5. Left groin hernia contains only fat. 6. Tiny umbilical hernia contains only fat.     Electronically Signed   By: Misty Stanley M.D.   On: 03/13/2022 07:41      Impression and Plan: We reviewed the findings of the CTA (4.5 cm ATAA, no evidence of intramural hematoma or dissection) and discussed the risk modifications for ATAA. Patient does not need surgery at this time. Patient will have CTA with further surveillance in one year. Of note, patient will be followed by Dr. Valeta Harms for right pulmonary nodule and Dr. Saralyn Pilar (has PPM)   Nani Skillern, PA-C [facility] [phone]

## 2022-04-30 ENCOUNTER — Other Ambulatory Visit: Payer: Self-pay | Admitting: *Deleted

## 2022-04-30 ENCOUNTER — Institutional Professional Consult (permissible substitution): Payer: Medicare PPO | Admitting: Physician Assistant

## 2022-04-30 VITALS — BP 124/74 | HR 84 | Resp 18 | Ht 76.0 in | Wt 390.0 lb

## 2022-04-30 DIAGNOSIS — I7121 Aneurysm of the ascending aorta, without rupture: Secondary | ICD-10-CM

## 2022-04-30 DIAGNOSIS — I8393 Asymptomatic varicose veins of bilateral lower extremities: Secondary | ICD-10-CM

## 2022-04-30 NOTE — Patient Instructions (Addendum)
  Risk Modification in those with ascending thoracic aortic aneurysm:  Continue good control of blood pressure (prefer SBP 130/80 or less)-on Losartan 100 mg daily  2. Avoid fluoroquinolone antibiotics (I.e Ciprofloxacin, Avelox, Levofloxacin, Ofloxacin)  3.  Use of statin (to decrease cardiovascular risk)-last lipid profile showed LDL 109, Triglycerides 140, and total cholesterol 177. Will defer to primary  4.  Exercise and activity limitations is individualized, but in general, contact sports are to be avoided and one should avoid heavy lifting (defined as half of ideal body weight) and exercises involving sustained Valsalva maneuver.  5. Counseling for those suspected of having genetically mediated disease. First-degree relatives of those with TAA disease should be screened as well as those who have a connective tissue disease (I.e with Marfan syndrome, Ehlers-Danlos syndrome, and Loeys-Dietz syndrome) or a  bicuspid aortic valve,have an increased risk for complications related to TAA. Does not apply to patient  6.He has previous tobacco abuse;encouraged continued cessation

## 2022-05-02 NOTE — Progress Notes (Signed)
Requested by:  Garner Nash, DO [place] [address] [place],  [place] [id number]  Reason for consultation: leg swelling     History of Present Illness   Edwin Ruiz is a 66 y.o. (August 13, 1955) male who presents for evaluation of leg swelling R > L. He explains that he has had swelling for years. It used to be worse when he was working as he was standing or sitting for prolonged periods of time. No he mostly notices the swelling if he is up on his legs for a long period of time. It does improve upon first waking or with some elevation. He use to wear knee high compression stockings while working but says he rarely wears them now. He does sometimes have aching and heaviness in his legs but again this is only if he does a lot of prolonged standing or walking. It is much better than it was when he was working as he says he used to experience this almost nightly. He denies any itching, burning, stinging, bleeding or ulceration. He has no history of DVT. He does not have any family history of DVT or venous disease that he knows of.   Venous symptoms include: swelling Onset/duration:  many years  Occupation:  retired, Quarry manager for [employer] Aggravating factors: sitting, standing, prolonged ambulation Alleviating factors: elevation Compression:  yes Helps:  yes Pain medications:  none Previous vein procedures:  no History of DVT:  no  Has known Ascending thoracic aortic aneurysm that is 4.5 cm by CTA chest 03/13/22. He is being followed [facility] for this. He will follow up again for repeat CTA in 6 months  Past Medical History:  Diagnosis Date   Acute cholecystitis due to biliary calculus 03/20/2022   Arthritis    Asthma    GERD (gastroesophageal reflux disease)    Hematuria    Hypertension    Presence of permanent cardiac pacemaker    Sleep apnea    Urticaria     Past Surgical History:  Procedure Laterality Date   APPENDECTOMY     COLONOSCOPY WITH PROPOFOL N/A 02/05/2016    Procedure: COLONOSCOPY WITH PROPOFOL;  Surgeon: Lollie Sails, MD;  Location: [facility] ENDOSCOPY;  Service: Endoscopy;  Laterality: N/A;   FOOT SURGERY     INSERT / REPLACE / REMOVE PACEMAKER     PPM GENERATOR CHANGEOUT N/A 01/16/2022   Procedure: PPM GENERATOR CHANGEOUT;  Surgeon: Isaias Cowman, MD;  Location: [facility];  Service: Cardiovascular;  Laterality: N/A;   TONSILLECTOMY      Social History   Socioeconomic History   Marital status: Married    Spouse name: Not on file   Number of children: Not on file   Years of education: Not on file   Highest education level: Not on file  Occupational History   Not on file  Tobacco Use   Smoking status: Former    Passive exposure: Past   Smokeless tobacco: Never  Substance and Sexual Activity   Alcohol use: Yes    Comment: occasional   Drug use: No   Sexual activity: Not on file  Other Topics Concern   Not on file  Social History Narrative   Not on file   Social Determinants of Health   Financial Resource Strain: Not on file  Food Insecurity: Not on file  Transportation Needs: Not on file  Physical Activity: Not on file  Stress: Not on file  Social Connections: Not on file  Intimate Partner Violence: Not on file   History reviewed. No pertinent family history.  Current Outpatient Medications  Medication Sig Dispense Refill   acetaminophen (TYLENOL) 500 MG tablet Take 500 mg by mouth every 6 (six) hours as needed.     losartan (COZAAR) 100 MG tablet Take 100 mg by mouth daily.     meloxicam (MOBIC) 15 MG tablet Take 15 mg by mouth daily.     omeprazole (PRILOSEC) 20 MG capsule Take 20 mg by mouth daily.     tadalafil (CIALIS) 20 MG tablet Take 20 mg by mouth daily as needed for erectile dysfunction.     No current facility-administered medications for this visit.    Allergies  Allergen Reactions   Penicillins     REVIEW OF SYSTEMS (negative unless checked):   Cardiac:  '[]'$  Chest pain or chest  pressure? '[]'$  Shortness of breath upon activity? '[]'$  Shortness of breath when lying flat? '[]'$  Irregular heart rhythm?  Vascular:  '[]'$  Pain in calf, thigh, or hip brought on by walking? '[]'$  Pain in feet at night that wakes you up from your sleep? '[]'$  Blood clot in your veins? '[x]'$  Leg swelling?  Pulmonary:  '[]'$  Oxygen at home? '[]'$  Productive cough? '[]'$  Wheezing?  Neurologic:  '[]'$  Sudden weakness in arms or legs? '[]'$  Sudden numbness in arms or legs? '[]'$  Sudden onset of difficult speaking or slurred speech? '[]'$  Temporary loss of vision in one eye? '[]'$  Problems with dizziness?  Gastrointestinal:  '[]'$  Blood in stool? '[]'$  Vomited blood?  Genitourinary:  '[]'$  Burning when urinating? '[]'$  Blood in urine?  Psychiatric:  '[]'$  Major depression  Hematologic:  '[]'$  Bleeding problems? '[]'$  Problems with blood clotting?  Dermatologic:  '[]'$  Rashes or ulcers?  Constitutional:  '[]'$  Fever or chills?  Ear/Nose/Throat:  '[]'$  Change in hearing? '[]'$  Nose bleeds? '[]'$  Sore throat?  Musculoskeletal:  '[]'$  Back pain? '[]'$  Joint pain? '[]'$  Muscle pain?   Physical Examination     Vitals:   05/06/22 1337  BP: (!) 142/72  Pulse: 64  Resp: 16  Temp: 98 F (36.7 C)  TempSrc: Temporal  SpO2: 97%  Weight: (!) 391 lb 11.2 oz (177.7 kg)  Height: '6\' 4"'$  (1.93 m)   Body mass index is 47.68 kg/m.  General:  WDWN in NAD; vital signs documented above Gait: Normal HENT: WNL, normocephalic Pulmonary: normal non-labored breathing  Cardiac: regular HR, without  Murmurs without carotid bruit Abdomen: obese,non distended Vascular Exam/Pulses:2+ Dp and PT pulses bilaterally. Feet warm and well perfused Extremities: with varicose veins right calf, with reticular veins, with edema, with mild stasis pigmentation bilaterally, without lipodermatosclerosis, without ulcers Musculoskeletal: no muscle wasting or atrophy  Neurologic: A&O X 3;  No focal weakness or paresthesias are detected Psychiatric:  The pt has Normal  affect.  Non-invasive Vascular Imaging   BLE Venous Insufficiency Duplex (05/06/22):  RLE:  No DVT and SVT GSV reflux SFJ to proximal calf GSV diameter 0.70-1 cm No SSV reflux  CFV deep venous reflux  Medical Decision Making   Edwin Ruiz is a 66 y.o. male who presents with: RLE chronic venous insufficiency with swelling. Duplex today shows no DVT or SVT. He does have deep venous insufficiency in the CFV. Superficial reflux in the GSV throughout. No SSV reflux. His GSV is of adequate size to be considered for lazer ablation. Based on the patient's history and examination, I recommend: daily elevation, compression stockings, exercise, weight reduction, and refraining from prolonged sitting or standing. I discussed with the  patient the use of her 20-30 mm thigh high compression stockings and need for 3 month trial of such should he decided to have an ablation. He otherwise should wear knee high compression. He was measured and fitted for both today.  At this time he is not interested in the ablation procedure. He will therefore follow up as needed if he has new or concerning symptoms    Karoline Caldwell, PA-C [facility]: [phone]  05/06/2022, 2:15 PM  Clinic MD: Trula Slade

## 2022-05-06 ENCOUNTER — Ambulatory Visit: Payer: Medicare PPO | Admitting: Physician Assistant

## 2022-05-06 ENCOUNTER — Encounter: Payer: Self-pay | Admitting: Physician Assistant

## 2022-05-06 ENCOUNTER — Ambulatory Visit (HOSPITAL_COMMUNITY)
Admission: RE | Admit: 2022-05-06 | Discharge: 2022-05-06 | Disposition: A | Discharge status: Home / Self Care | Payer: Medicare PPO | Source: Ambulatory Visit | Attending: Surgery | Admitting: Surgery

## 2022-05-06 VITALS — BP 142/72 | HR 64 | Temp 98.0°F | Resp 16 | Ht 76.0 in | Wt 391.7 lb

## 2022-05-06 DIAGNOSIS — I872 Venous insufficiency (chronic) (peripheral): Secondary | ICD-10-CM | POA: Diagnosis not present

## 2022-05-06 DIAGNOSIS — I8393 Asymptomatic varicose veins of bilateral lower extremities: Secondary | ICD-10-CM

## 2022-09-02 ENCOUNTER — Encounter: Payer: Self-pay | Admitting: *Deleted

## 2022-09-03 ENCOUNTER — Ambulatory Visit: Payer: Medicare PPO | Admitting: Certified Registered"

## 2022-09-03 ENCOUNTER — Ambulatory Visit
Admission: RE | Admit: 2022-09-03 | Discharge: 2022-09-03 | Disposition: A | Discharge status: Home / Self Care | Payer: Medicare PPO | Attending: Gastroenterology | Admitting: Gastroenterology

## 2022-09-03 ENCOUNTER — Encounter
Admission: RE | Disposition: A | Discharge status: Home / Self Care | Payer: Self-pay | Source: Home / Self Care | Attending: Gastroenterology

## 2022-09-03 ENCOUNTER — Encounter: Payer: Self-pay | Admitting: *Deleted

## 2022-09-03 DIAGNOSIS — K640 First degree hemorrhoids: Secondary | ICD-10-CM | POA: Insufficient documentation

## 2022-09-03 DIAGNOSIS — Z6841 Body Mass Index (BMI) 40.0 and over, adult: Secondary | ICD-10-CM | POA: Diagnosis not present

## 2022-09-03 DIAGNOSIS — Z87891 Personal history of nicotine dependence: Secondary | ICD-10-CM | POA: Insufficient documentation

## 2022-09-03 DIAGNOSIS — K219 Gastro-esophageal reflux disease without esophagitis: Secondary | ICD-10-CM | POA: Insufficient documentation

## 2022-09-03 DIAGNOSIS — Z1211 Encounter for screening for malignant neoplasm of colon: Secondary | ICD-10-CM | POA: Diagnosis not present

## 2022-09-03 DIAGNOSIS — G4730 Sleep apnea, unspecified: Secondary | ICD-10-CM | POA: Diagnosis not present

## 2022-09-03 DIAGNOSIS — J45909 Unspecified asthma, uncomplicated: Secondary | ICD-10-CM | POA: Diagnosis not present

## 2022-09-03 DIAGNOSIS — I10 Essential (primary) hypertension: Secondary | ICD-10-CM | POA: Diagnosis not present

## 2022-09-03 DIAGNOSIS — Z950 Presence of cardiac pacemaker: Secondary | ICD-10-CM | POA: Diagnosis not present

## 2022-09-03 DIAGNOSIS — Z9049 Acquired absence of other specified parts of digestive tract: Secondary | ICD-10-CM | POA: Diagnosis not present

## 2022-09-03 DIAGNOSIS — E669 Obesity, unspecified: Secondary | ICD-10-CM | POA: Insufficient documentation

## 2022-09-03 HISTORY — PX: COLONOSCOPY WITH PROPOFOL: SHX5780

## 2022-09-03 SURGERY — COLONOSCOPY WITH PROPOFOL
Anesthesia: General

## 2022-09-03 MED ORDER — PROPOFOL 500 MG/50ML IV EMUL
INTRAVENOUS | Status: DC | PRN
  Administered 2022-09-03: 150 ug/kg/min via INTRAVENOUS

## 2022-09-03 MED ORDER — SODIUM CHLORIDE 0.9 % IV SOLN
INTRAVENOUS | Status: DC
  Administered 2022-09-03: 1000 mL via INTRAVENOUS

## 2022-09-03 MED ORDER — LIDOCAINE HCL (CARDIAC) PF 100 MG/5ML IV SOSY
PREFILLED_SYRINGE | INTRAVENOUS | Status: DC | PRN
  Administered 2022-09-03: 100 mg via INTRAVENOUS

## 2022-09-03 NOTE — Interval H&P Note (Signed)
History and Physical Interval Note:  09/03/2022 11:23 AM  Edwin Ruiz  has presented today for surgery, with the diagnosis of hx of adenomatous polyp of colon,.  The various methods of treatment have been discussed with the patient and family. After consideration of risks, benefits and other options for treatment, the patient has consented to  Procedure(s): COLONOSCOPY WITH PROPOFOL (N/A) as a surgical intervention.  The patient's history has been reviewed, patient examined, no change in status, stable for surgery.  I have reviewed the patient's chart and labs.  Questions were answered to the patient's satisfaction.     Lesly Rubenstein  Ok to proceed with colonoscopy

## 2022-09-03 NOTE — Anesthesia Preprocedure Evaluation (Signed)
Anesthesia Evaluation  Patient identified by MRN, date of birth, ID band Patient awake    Reviewed: Allergy & Precautions, H&P , NPO status , Patient's Chart, lab work & pertinent test results, reviewed documented beta blocker date and time   History of Anesthesia Complications Negative for: history of anesthetic complications  Airway Mallampati: III  TM Distance: >3 FB Neck ROM: full    Dental  (+) Teeth Intact, Dental Advidsory Given   Pulmonary neg pulmonary ROS, neg shortness of breath, asthma , sleep apnea and Continuous Positive Airway Pressure Ventilation , neg recent URI, former smoker   Pulmonary exam normal        Cardiovascular Exercise Tolerance: Poor hypertension, On Medications (-) angina (-) Past MI and (-) Cardiac Stents Normal cardiovascular exam+ dysrhythmias (sinus brady, but none since pacemaker) + pacemaker (-) Valvular Problems/Murmurs Rhythm:regular Rate:Normal     Neuro/Psych negative neurological ROS  negative psych ROS   GI/Hepatic Neg liver ROS,GERD  Medicated,,  Endo/Other  negative endocrine ROS    Renal/GU negative Renal ROS  negative genitourinary   Musculoskeletal   Abdominal   Peds  Hematology negative hematology ROS (+)   Anesthesia Other Findings Past Medical History: No date: Arthritis No date: Asthma No date: GERD (gastroesophageal reflux disease) No date: Hematuria No date: Hypertension No date: Presence of permanent cardiac pacemaker No date: Sleep apnea No date: Urticaria Past Surgical History: No date: APPENDECTOMY 02/05/2016: COLONOSCOPY WITH PROPOFOL; N/A     Comment:  Procedure: COLONOSCOPY WITH PROPOFOL;  Surgeon: Lollie Sails, MD;  Location: [facility] ENDOSCOPY;  Service:               Endoscopy;  Laterality: N/A; No date: FOOT SURGERY No date: INSERT / REPLACE / REMOVE PACEMAKER 01/16/2022: PPM GENERATOR CHANGEOUT; N/A     Comment:  Procedure: PPM  GENERATOR CHANGEOUT;  Surgeon: Isaias Cowman, MD;  Location: [place] CV LAB;  Service:              Cardiovascular;  Laterality: N/A; No date: TONSILLECTOMY BMI    Body Mass Index: 46.26 kg/m     Reproductive/Obstetrics negative OB ROS                             Anesthesia Physical Anesthesia Plan  ASA: 3  Anesthesia Plan: General   Post-op Pain Management:    Induction: Intravenous  PONV Risk Score and Plan: 2 and Propofol infusion and TIVA  Airway Management Planned: Natural Airway and Nasal Cannula  Additional Equipment:   Intra-op Plan:   Post-operative Plan:   Informed Consent: I have reviewed the patients History and Physical, chart, labs and discussed the procedure including the risks, benefits and alternatives for the proposed anesthesia with the patient or authorized representative who has indicated his/her understanding and acceptance.     Dental Advisory Given  Plan Discussed with: CRNA  Anesthesia Plan Comments:         Anesthesia Quick Evaluation

## 2022-09-03 NOTE — Op Note (Signed)
[facility] Patient Name: Edwin Ruiz Procedure Date: 09/03/2022 11:08 AM MRN: [id number] Account #: [id number] Date of Birth: 12-09-1955 Admit Type: Outpatient Age: 67 Room: [facility] ENDO ROOM 1 Gender: Male Note Status: Finalized Instrument Name: Jasper Riling [id number] Procedure:             Colonoscopy Indications:           Surveillance: Personal history of adenomatous polyps                         on last colonoscopy > 5 years ago Providers:             Andrey Farmer MD, MD Medicines:             Monitored Anesthesia Care Complications:         No immediate complications. Procedure:             Pre-Anesthesia Assessment:                        - Prior to the procedure, a History and Physical was                         performed, and patient medications and allergies were                         reviewed. The patient is competent. The risks and                         benefits of the procedure and the sedation options and                         risks were discussed with the patient. All questions                         were answered and informed consent was obtained.                         Patient identification and proposed procedure were                         verified by the physician, the nurse, the                         anesthesiologist, the anesthetist and the technician                         in the endoscopy suite. Mental Status Examination:                         alert and oriented. Airway Examination: normal                         oropharyngeal airway and neck mobility. Respiratory                         Examination: clear to auscultation. CV Examination:                         normal. Prophylactic Antibiotics: The patient does not  require prophylactic antibiotics. Prior                         Anticoagulants: The patient has taken no anticoagulant                         or antiplatelet agents. ASA  Grade Assessment: III - A                         patient with severe systemic disease. After reviewing                         the risks and benefits, the patient was deemed in                         satisfactory condition to undergo the procedure. The                         anesthesia plan was to use monitored anesthesia care                         (MAC). Immediately prior to administration of                         medications, the patient was re-assessed for adequacy                         to receive sedatives. The heart rate, respiratory                         rate, oxygen saturations, blood pressure, adequacy of                         pulmonary ventilation, and response to care were                         monitored throughout the procedure. The physical                         status of the patient was re-assessed after the                         procedure.                        After obtaining informed consent, the colonoscope was                         passed under direct vision. Throughout the procedure,                         the patient's blood pressure, pulse, and oxygen                         saturations were monitored continuously. The                         Colonoscope was introduced through the anus and  advanced to the the cecum, identified by appendiceal                         orifice and ileocecal valve. The colonoscopy was                         technically difficult and complex due to significant                         looping. Successful completion of the procedure was                         aided by using scope torsion. The patient tolerated                         the procedure well. The quality of the bowel                         preparation was good. The ileocecal valve, appendiceal                         orifice, and rectum were photographed. Findings:      The perianal and digital rectal examinations were normal.       Internal hemorrhoids were found during retroflexion. The hemorrhoids       were Grade I (internal hemorrhoids that do not prolapse).      The exam was otherwise without abnormality on direct and retroflexion       views. Impression:            - Internal hemorrhoids.                        - The examination was otherwise normal on direct and                         retroflexion views.                        - No specimens collected. Recommendation:        - Discharge patient to home.                        - Resume previous diet.                        - Continue present medications.                        - Repeat colonoscopy in 5 years for surveillance.                        - Return to referring physician as previously                         scheduled. Procedure Code(s):     --- Professional ---                        F0277, Colorectal cancer screening; colonoscopy on  individual at high risk Diagnosis Code(s):     --- Professional ---                        Z86.010, Personal history of colonic polyps                        K64.0, First degree hemorrhoids CPT copyright 2022 American Medical Association. All rights reserved. The codes documented in this report are preliminary and upon coder review may  be revised to meet current compliance requirements. Andrey Farmer MD, MD 09/03/2022 11:47:49 AM Number of Addenda: 0 Note Initiated On: 09/03/2022 11:08 AM Scope Withdrawal Time: 0 hours 7 minutes 41 seconds  Total Procedure Duration: 0 hours 14 minutes 37 seconds  Estimated Blood Loss:  Estimated blood loss: none.      [facility]

## 2022-09-03 NOTE — Transfer of Care (Signed)
Immediate Anesthesia Transfer of Care Note  Patient: Edwin Ruiz  Procedure(s) Performed: COLONOSCOPY WITH PROPOFOL  Patient Location: PACU  Anesthesia Type:General  Level of Consciousness: awake, alert , oriented, and patient cooperative  Airway & Oxygen Therapy: Patient Spontanous Breathing  Post-op Assessment: Report given to RN and Post -op Vital signs reviewed and stable  Post vital signs: Reviewed and stable  Last Vitals:  Vitals Value Taken Time  BP 111/63 09/03/22 1150  Temp    Pulse 64 09/03/22 1150  Resp 13 09/03/22 1150  SpO2 96 % 09/03/22 1150  Vitals shown include unvalidated device data.  Last Pain:  Vitals:   09/03/22 1026  TempSrc: Temporal  PainSc: 0-No pain         Complications: No notable events documented.

## 2022-09-03 NOTE — H&P (Signed)
Outpatient short stay form Pre-procedure 09/03/2022  Edwin Rubenstein, MD  Primary Physician: Maryland Pink, MD  Reason for visit:  Surveillance  History of present illness:    67 y/o gentleman with history of hypertension and obesity here for surveillance colonoscopy. Last colonoscopy in 2017 with two small Ta's. No blood thinners. No family history of GI malignancies. History of cholecystectomy.    Current Facility-Administered Medications:    0.9 %  sodium chloride infusion, , Intravenous, Continuous, Arlenis Blaydes, Hilton Cork, MD, Last Rate: 20 mL/hr at 09/03/22 1045, 1,000 mL at 09/03/22 1045  Medications Prior to Admission  Medication Sig Dispense Refill Last Dose   acetaminophen (TYLENOL) 500 MG tablet Take 500 mg by mouth every 6 (six) hours as needed.   Past Week   losartan (COZAAR) 100 MG tablet Take 100 mg by mouth daily.   09/02/2022   meloxicam (MOBIC) 15 MG tablet Take 15 mg by mouth daily.   09/02/2022   omeprazole (PRILOSEC) 20 MG capsule Take 20 mg by mouth daily.   09/02/2022   tadalafil (CIALIS) 20 MG tablet Take 20 mg by mouth daily as needed for erectile dysfunction.    at prn     Allergies  Allergen Reactions   Penicillins      Past Medical History:  Diagnosis Date   Acute cholecystitis due to biliary calculus 03/20/2022   Arthritis    Asthma    GERD (gastroesophageal reflux disease)    Hematuria    Hypertension    Presence of permanent cardiac pacemaker    Sleep apnea    Urticaria     Review of systems:  Otherwise negative.    Physical Exam  Gen: Alert, oriented. Appears stated age.  HEENT: PERRLA. Lungs: No respiratory distress CV: RRR Abd: soft, benign, no masses Ext: No edem    Planned procedures: Proceed with colonoscopy. The patient understands the nature of the planned procedure, indications, risks, alternatives and potential complications including but not limited to bleeding, infection, perforation, damage to internal organs and possible  oversedation/side effects from anesthesia. The patient agrees and gives consent to proceed.  Please refer to procedure notes for findings, recommendations and patient disposition/instructions.     Edwin Rubenstein, MD [facility]

## 2022-09-04 ENCOUNTER — Encounter: Payer: Self-pay | Admitting: Gastroenterology

## 2022-09-19 NOTE — Anesthesia Postprocedure Evaluation (Signed)
Anesthesia Post Note  Patient: Edwin Ruiz  Procedure(s) Performed: COLONOSCOPY WITH PROPOFOL  Patient location during evaluation: Endoscopy Anesthesia Type: General Level of consciousness: awake and alert Pain management: pain level controlled Vital Signs Assessment: post-procedure vital signs reviewed and stable Respiratory status: spontaneous breathing, nonlabored ventilation, respiratory function stable and patient connected to nasal cannula oxygen Cardiovascular status: blood pressure returned to baseline and stable Postop Assessment: no apparent nausea or vomiting Anesthetic complications: no   There were no known notable events for this encounter.   Last Vitals:  Vitals:   09/03/22 1200 09/03/22 1210  BP: 119/67 (!) 141/74  Pulse: (!) 59 61  Resp: 18 (!) 22  Temp:    SpO2: 95% 98%    Last Pain:  Vitals:   09/03/22 1210  TempSrc:   PainSc: 0-No pain                 Martha Clan

## 2022-10-07 ENCOUNTER — Other Ambulatory Visit: Payer: Self-pay | Admitting: Thoracic Surgery (Cardiothoracic Vascular Surgery)

## 2022-10-07 DIAGNOSIS — I7121 Aneurysm of the ascending aorta, without rupture: Secondary | ICD-10-CM

## 2022-11-12 ENCOUNTER — Encounter: Payer: Self-pay | Admitting: Physician Assistant

## 2022-11-12 ENCOUNTER — Ambulatory Visit: Payer: Medicare PPO | Admitting: Physician Assistant

## 2022-11-12 ENCOUNTER — Ambulatory Visit
Admission: RE | Admit: 2022-11-12 | Discharge: 2022-11-12 | Disposition: A | Discharge status: Home / Self Care | Payer: Medicare PPO | Source: Ambulatory Visit | Attending: Thoracic Surgery (Cardiothoracic Vascular Surgery) | Admitting: Thoracic Surgery (Cardiothoracic Vascular Surgery)

## 2022-11-12 VITALS — BP 170/94 | HR 83 | Resp 20 | Ht 76.0 in | Wt 391.0 lb

## 2022-11-12 DIAGNOSIS — I7121 Aneurysm of the ascending aorta, without rupture: Secondary | ICD-10-CM

## 2022-11-12 MED ORDER — IOPAMIDOL (ISOVUE-370) INJECTION 76%
75.0000 mL | Freq: Once | INTRAVENOUS | Status: AC | PRN
  Administered 2022-11-12: 75 mL via INTRAVENOUS

## 2022-11-12 NOTE — Progress Notes (Signed)
[address]       [place] [address]             [phone]       HPI:  Mr. Edwin Ruiz is a 67 year old male with past medical history notable for hypertension, obstructive sleep apnea, obesity, history of sick sinus syndrome status post placement of a permanent pacemaker, and status post laparoscopic cholecystectomy in 2023.  Last year, he underwent a CTA of the chest abdomen and pelvis as part of medical workup and was incidentally noted to have a 5 mm right middle lobe nodule that is being followed by Dr. Audie Box.  Additionally, he was noted to have a 4.5 cm ascending aortic aneurysm for which he was referred to [place].  He returns today for scheduled 21-month follow-up as recommended.  He has no new problems or concerns.  Denies any chest pain.  He remains quite active with tending to his property, gardening, and canning vegetables.   Current Outpatient Medications  Medication Sig Dispense Refill   acetaminophen (TYLENOL) 500 MG tablet Take 500 mg by mouth every 6 (six) hours as needed.     losartan (COZAAR) 100 MG tablet Take 100 mg by mouth daily.     meloxicam (MOBIC) 15 MG tablet Take 15 mg by mouth daily.     omeprazole (PRILOSEC) 20 MG capsule Take 20 mg by mouth daily.     tadalafil (CIALIS) 20 MG tablet Take 20 mg by mouth daily as needed for erectile dysfunction.     No current facility-administered medications for this visit.    Physical Exam  Vital signs Blood pressure 170/94 Pulse 83 Respirations 20 SpO2 95% Weight 177 kg  Heart: Regular rate and rhythm, no murmur Chest: Breath sounds are clear to auscultation Abdomen: Obese, nontender  Extremities: Mild lower extremity edema. All are warm and well-perfused Neuro: grossly intact  Diagnostic Tests:  CLINICAL DATA:  Aortic aneurysm.   EXAM: CT ANGIOGRAPHY CHEST WITH CONTRAST   TECHNIQUE: Multidetector CT imaging of the chest was performed using the standard protocol during  bolus administration of intravenous contrast. Multiplanar CT image reconstructions and MIPs were obtained to evaluate the vascular anatomy.   RADIATION DOSE REDUCTION: This exam was performed according to the departmental dose-optimization program which includes automated exposure control, adjustment of the mA and/or kV according to patient size and/or use of iterative reconstruction technique.   CONTRAST:  75mL ISOVUE-370 IOPAMIDOL (ISOVUE-370) INJECTION 76%   COMPARISON:  CTA chest 03/13/2022.   FINDINGS: Cardiovascular: The ascending thoracic aorta is aneurysmal measuring up to 4.5 cm (4-42, 5-76), unchanged. There is no evidence of dissection. The heart size is normal. There is no pericardial effusion. A left chest wall cardiac device is noted with leads terminating in the right atrium and right ventricle. The pulmonary vasculature is unremarkable.   Mediastinum:the imaged thyroid is unremarkable. The esophagus is grossly unremarkable. There is no mediastinal, hilar, or axillary lymphadenopathy.   Lungs/Pleura: The trachea and central airways are patent.   There is linear atelectasis or scar in the lateral right lower lobe. There is no focal consolidation or pulmonary edema. There is no pleural effusion or pneumothorax.   A sub 6 mm nodule in the right middle lobe is unchanged (11-74). There are no new, enlarging, or suspicious nodules.   Upper Abdomen: A benign right upper pole renal cyst and additional cyst in the left hepatic lobe are noted requiring no specific imaging follow-up. The imaged portions  of the upper abdominal viscera are otherwise unremarkable.   Musculoskeletal: There are flowing anterior osteophytes throughout the thoracic spine consistent with diffuse idiopathic skeletal hyperostosis. There is no acute osseous abnormality or suspicious osseous lesion.   Review of the MIP images confirms the above findings.   IMPRESSION: Stable 4.5 cm ascending  thoracic aortic aneurysm. Recommend continued semiannual follow-up and referral to cardiothoracic surgery if not already done.     Electronically Signed   By: Lesia Hausen M.D.   On: 11/12/2022 13:17  Impression / Plan: Stable 4.5cm ascending aortic aneurysm and stable RML pulmonary nodule (this is being followed by Dr. Tonia Brooms).  Blood pressure has historically been fairly well controlled on Losartan but was 170/94 in the office today.  The importance of careful blood pressure control was again discussed and Mr. Gatlin assured me will discuss this with his PCP during a scheduled physical later this week. The recommendation for ongoing surveillance, avoidance of prolonged strenuous activity, and avoidance of the fluoroquinolone class of antibiotics was also discussed.   I spent over reviewing the medical record, available imaging and in face to face discussion with Mr. Mendizabal.   Scheduled for CTA chest in 6 months.    Leary Roca, PA-C [facility] [phone]

## 2022-11-12 NOTE — Patient Instructions (Signed)
   Continue good control of blood pressure (prefer SBP 130/80 or less)-on Coreg, Amlodipine, Entresto  2. Avoid fluoroquinolone antibiotics (I.e Ciprofloxacin, Avelox, Levofloxacin, Ofloxacin)  3.  Use of statin (to decrease cardiovascular risk)  4.  Exercise and activity limitations is individualized, but in general, contact sports are to be  avoided and one should avoid heavy lifting (defined as half of ideal body weight) and exercises involving sustained Valsalva maneuver.  5 Follow up in 6 months with CTA chest

## 2023-01-02 ENCOUNTER — Encounter: Payer: Self-pay | Admitting: Urology

## 2023-01-02 ENCOUNTER — Ambulatory Visit: Payer: Medicare PPO | Admitting: Urology

## 2023-01-02 VITALS — BP 110/66 | HR 62 | Ht 76.0 in | Wt 390.0 lb

## 2023-01-02 DIAGNOSIS — N5201 Erectile dysfunction due to arterial insufficiency: Secondary | ICD-10-CM

## 2023-01-02 DIAGNOSIS — N401 Enlarged prostate with lower urinary tract symptoms: Secondary | ICD-10-CM | POA: Diagnosis not present

## 2023-01-02 MED ORDER — SILDENAFIL CITRATE 100 MG PO TABS: ORAL_TABLET | ORAL | 0 refills | Status: DC

## 2023-01-02 MED ORDER — TADALAFIL 5 MG PO TABS: 5.0000 mg | ORAL_TABLET | Freq: Every day | ORAL | 3 refills | Status: DC | PRN

## 2023-01-02 NOTE — Progress Notes (Signed)
Marcelle Overlie Plume,acting as a scribe for Riki Altes, MD.,have documented all relevant documentation on the behalf of Riki Altes, MD,as directed by  Riki Altes, MD while in the presence of Riki Altes, MD.  01/02/2023 3:35 PM   Edwin Ruiz 1956/02/21 [id number]  Referring provider: [name], MD [address] [facility] [place],  [place] [address]  Chief Complaint  Patient presents with   Erectile Dysfunction    HPI: Edwin Ruiz is a 67 y.o. male who is referred for evaluation of erectile dysfunction.  Organic risk factors include hypertension, hyperlipidemia, antihypertensive medications and previous tobacco history He took sildenafil years ago, which was effective, but has not tried recently. Tadalafil was partially effective at the 10 mg dose. No pain or curvature with erections. Also complains of urinary frequency and urgency, which he has noted improves when taking tadalafil.  PMH: Past Medical History:  Diagnosis Date   Acute cholecystitis due to biliary calculus 03/20/2022   Arthritis    Asthma    GERD (gastroesophageal reflux disease)    Hematuria    Hypertension    Presence of permanent cardiac pacemaker    Sleep apnea    Urticaria     Surgical History: Past Surgical History:  Procedure Laterality Date   APPENDECTOMY     COLONOSCOPY WITH PROPOFOL N/A 02/05/2016   Procedure: COLONOSCOPY WITH PROPOFOL;  Surgeon: Christena Deem, MD;  Location: [facility] ENDOSCOPY;  Service: Endoscopy;  Laterality: N/A;   COLONOSCOPY WITH PROPOFOL N/A 09/03/2022   Procedure: COLONOSCOPY WITH PROPOFOL;  Surgeon: Regis Bill, MD;  Location: [facility] ENDOSCOPY;  Service: Endoscopy;  Laterality: N/A;   FOOT SURGERY     INSERT / REPLACE / REMOVE PACEMAKER     PPM GENERATOR CHANGEOUT N/A 01/16/2022   Procedure: PPM GENERATOR CHANGEOUT;  Surgeon: Marcina Millard, MD;  Location: [facility] INVASIVE CV LAB;  Service: Cardiovascular;  Laterality: N/A;    TONSILLECTOMY      Home Medications:  Allergies as of 01/02/2023       Reactions   Penicillins         Medication List        Accurate as of January 02, 2023  3:35 PM. If you have any questions, ask your nurse or doctor.          acetaminophen 500 MG tablet Commonly known as: TYLENOL Take 500 mg by mouth every 6 (six) hours as needed.   amLODipine 5 MG tablet Commonly known as: NORVASC Take by mouth.   losartan 100 MG tablet Commonly known as: COZAAR Take 100 mg by mouth daily.   meloxicam 15 MG tablet Commonly known as: MOBIC Take 15 mg by mouth daily.   omeprazole 20 MG capsule Commonly known as: PRILOSEC Take 20 mg by mouth daily.   sildenafil 100 MG tablet Commonly known as: VIAGRA Take 1 tablet 1 hour prior to intercourse. Started by: Riki Altes, MD   tadalafil 5 MG tablet Commonly known as: CIALIS Take 1 tablet (5 mg total) by mouth daily as needed for erectile dysfunction. What changed:  medication strength how much to take Changed by: Riki Altes, MD        Allergies:  Allergies  Allergen Reactions   Penicillins    Social History:  reports that he has quit smoking. He has been exposed to tobacco smoke. He has never used smokeless tobacco. He reports current alcohol use. He reports that he does not  use drugs.   Physical Exam: BP 110/66   Pulse 62   Ht 6\' 4"  (1.93 m)   Wt (!) 390 lb (176.9 kg)   BMI 47.47 kg/m   Constitutional:  Alert and oriented, No acute distress. HEENT: [place] AT Respiratory: Normal respiratory effort, no increased work of breathing. GU: Phallus without lesions or plaques. Testes descended bilaterally without masses or tenderness Psychiatric: Normal mood and affect.  Assessment & Plan:    1. Erectile dysfunction He was interested in a retrial of sildenafil and Rx 100 mg sent to pharmacy. Take 30-60 minutes prior to intercourse. We discussed second line options, including vacuum erection devices and  intracavernosal injections. He was provided literature Penile implant surgery was also briefly discussed.  2. BPH with LUTS He has seen improvement with PRN tadalafil dose, we will send an Rx of tadalafil 5 mg daily  I have reviewed the above documentation for accuracy and completeness, and I agree with the above.   Riki Altes, MD  [facility] [address] [place], [place] [address] [phone]

## 2023-01-02 NOTE — Patient Instructions (Signed)
[employer] EDEX

## 2023-01-06 ENCOUNTER — Ambulatory Visit: Payer: Medicare PPO | Admitting: Urology

## 2023-03-19 ENCOUNTER — Ambulatory Visit: Payer: Medicare PPO

## 2023-03-26 ENCOUNTER — Encounter: Payer: Self-pay | Admitting: Pulmonary Disease

## 2023-03-26 ENCOUNTER — Ambulatory Visit: Payer: Medicare PPO | Admitting: Pulmonary Disease

## 2023-03-26 VITALS — BP 138/66 | HR 90 | Ht 76.0 in | Wt 394.4 lb

## 2023-03-26 DIAGNOSIS — R911 Solitary pulmonary nodule: Secondary | ICD-10-CM

## 2023-03-26 NOTE — Patient Instructions (Signed)
Thank you for visiting Dr. Tonia Brooms at [facility]. Today we recommend the following:  Orders Placed This Encounter  Procedures   CT Chest Wo Contrast   Follow up for lung nodule.   Return in about 1 year (around 03/25/2024) for with APP or Dr. Tonia Brooms.    Please do your part to reduce the spread of COVID-19.

## 2023-03-26 NOTE — Progress Notes (Signed)
Synopsis: Referred in Aug 2023 for lung nodules by [name], MD  Subjective:   PATIENT ID: Edwin Ruiz GENDER: male DOB: 09-07-1955, MRN: [id number]  Chief Complaint  Patient presents with   Follow-up    F/up on lung nodule.    This is a 67 year old gentleman, past medical history of GERD, hypertension, obesity.  Patient had abdominal pain was seen in the emergency department had a CT chest abdomen pelvis completed.  Patient's father had a history of an abdominal AAA.  Patient was found to have an incidental right middle lobe 5 mm pulmonary nodule.  He has a history of tobacco use.  He is a former smoker.  OV 03/26/2023: Patient seen today for follow-up after CT imaging.  It appears as if the patient was scheduled for his CT but was never informed of his CT appointment.  Therefore he no showed for the appointment and we do not have a CT scan to review today.  Patient    Past Medical History:  Diagnosis Date   Acute cholecystitis due to biliary calculus 03/20/2022   Arthritis    Asthma    GERD (gastroesophageal reflux disease)    Hematuria    Hypertension    Presence of permanent cardiac pacemaker    Sleep apnea    Urticaria      No family history on file.   Past Surgical History:  Procedure Laterality Date   APPENDECTOMY     COLONOSCOPY WITH PROPOFOL N/A 02/05/2016   Procedure: COLONOSCOPY WITH PROPOFOL;  Surgeon: Christena Deem, MD;  Location: [facility] ENDOSCOPY;  Service: Endoscopy;  Laterality: N/A;   COLONOSCOPY WITH PROPOFOL N/A 09/03/2022   Procedure: COLONOSCOPY WITH PROPOFOL;  Surgeon: Regis Bill, MD;  Location: [facility] ENDOSCOPY;  Service: Endoscopy;  Laterality: N/A;   FOOT SURGERY     INSERT / REPLACE / REMOVE PACEMAKER     PPM GENERATOR CHANGEOUT N/A 01/16/2022   Procedure: PPM GENERATOR CHANGEOUT;  Surgeon: Marcina Millard, MD;  Location: [facility] INVASIVE CV LAB;  Service: Cardiovascular;  Laterality: N/A;   TONSILLECTOMY      Social History    Socioeconomic History   Marital status: Married    Spouse name: Not on file   Number of children: Not on file   Years of education: Not on file   Highest education level: Not on file  Occupational History   Not on file  Tobacco Use   Smoking status: Former    Passive exposure: Past   Smokeless tobacco: Never  Vaping Use   Vaping status: Never Used  Substance and Sexual Activity   Alcohol use: Yes    Comment: occasional   Drug use: No   Sexual activity: Not on file  Other Topics Concern   Not on file  Social History Narrative   Not on file   Social Determinants of Health   Financial Resource Strain: Low Risk  (11/14/2022)   Received from [facility], [facility]   Overall Financial Resource Strain (CARDIA)    Difficulty of Paying Living Expenses: Not hard at all  Food Insecurity: No Food Insecurity (11/14/2022)   Received from [facility], [facility]   Hunger Vital Sign    Worried About Running Out of Food in the Last Year: Never true    Ran Out of Food in the Last Year: Never true  Transportation Needs: No Transportation Needs (11/14/2022)   Received from [facility],  [facility]   PRAPARE - Transportation    In the past 12 months, has lack of transportation kept you from medical appointments or from getting medications?: No    Lack of Transportation (Non-Medical): No  Physical Activity: Not on file  Stress: Not on file  Social Connections: Not on file  Intimate Partner Violence: Not on file     Allergies  Allergen Reactions   Penicillins      Outpatient Medications Prior to Visit  Medication Sig Dispense Refill   acetaminophen (TYLENOL) 500 MG tablet Take 500 mg by mouth every 6 (six) hours as needed.     amLODipine (NORVASC) 5 MG tablet Take by mouth.     meloxicam (MOBIC) 15 MG tablet Take 15 mg by mouth daily.     olmesartan (BENICAR) 40 MG tablet  Take 40 mg by mouth daily.     omeprazole (PRILOSEC) 20 MG capsule Take 20 mg by mouth daily.     sildenafil (VIAGRA) 100 MG tablet Take 1 tablet 1 hour prior to intercourse. 6 tablet 0   tadalafil (CIALIS) 5 MG tablet Take 1 tablet (5 mg total) by mouth daily as needed for erectile dysfunction. 30 tablet 3   losartan (COZAAR) 100 MG tablet Take 100 mg by mouth daily. (Patient not taking: Reported on 03/26/2023)     No facility-administered medications prior to visit.    Review of Systems  Constitutional:  Negative for chills, fever, malaise/fatigue and weight loss.  HENT:  Negative for hearing loss, sore throat and tinnitus.   Eyes:  Negative for blurred vision and double vision.  Respiratory:  Negative for cough, hemoptysis, sputum production, shortness of breath, wheezing and stridor.   Cardiovascular:  Negative for chest pain, palpitations, orthopnea, leg swelling and PND.  Gastrointestinal:  Negative for abdominal pain, constipation, diarrhea, heartburn, nausea and vomiting.  Genitourinary:  Negative for dysuria, hematuria and urgency.  Musculoskeletal:  Negative for joint pain and myalgias.  Skin:  Negative for itching and rash.  Neurological:  Negative for dizziness, tingling, weakness and headaches.  Endo/Heme/Allergies:  Negative for environmental allergies. Does not bruise/bleed easily.  Psychiatric/Behavioral:  Negative for depression. The patient is not nervous/anxious and does not have insomnia.   All other systems reviewed and are negative.    Objective:  Physical Exam Vitals reviewed.  Constitutional:      General: He is not in acute distress.    Appearance: He is well-developed. He is obese.  HENT:     Head: Normocephalic and atraumatic.  Eyes:     General: No scleral icterus.    Conjunctiva/sclera: Conjunctivae normal.     Pupils: Pupils are equal, round, and reactive to light.  Neck:     Vascular: No JVD.     Trachea: No tracheal deviation.  Cardiovascular:      Rate and Rhythm: Normal rate and regular rhythm.     Heart sounds: Normal heart sounds. No murmur heard. Pulmonary:     Effort: Pulmonary effort is normal. No tachypnea, accessory muscle usage or respiratory distress.     Breath sounds: No stridor. No wheezing, rhonchi or rales.  Abdominal:     General: There is no distension.     Palpations: Abdomen is soft.     Tenderness: There is no abdominal tenderness.  Musculoskeletal:        General: No tenderness.     Cervical back: Neck supple.  Lymphadenopathy:     Cervical: No cervical adenopathy.  Skin:  General: Skin is warm and dry.     Capillary Refill: Capillary refill takes less than 2 seconds.     Findings: No rash.  Neurological:     Mental Status: He is alert and oriented to person, place, and time.  Psychiatric:        Behavior: Behavior normal.      Vitals:   03/26/23 1303  BP: 138/66  Pulse: 90  SpO2: 95%  Weight: (!) 394 lb 6.4 oz (178.9 kg)  Height: 6\' 4"  (1.93 m)   95% on RA BMI Readings from Last 3 Encounters:  03/26/23 48.01 kg/m  01/02/23 47.47 kg/m  11/12/22 47.59 kg/m   Wt Readings from Last 3 Encounters:  03/26/23 (!) 394 lb 6.4 oz (178.9 kg)  01/02/23 (!) 390 lb (176.9 kg)  11/12/22 (!) 391 lb (177.4 kg)     CBC    Component Value Date/Time   WBC 4.7 04/18/2022 1023   RBC 3.87 (L) 04/18/2022 1023   HGB 12.5 (L) 04/18/2022 1023   HGB 13.2 02/09/2014 1403   HCT 35.9 (L) 04/18/2022 1023   HCT 37.7 (L) 02/09/2014 1403   PLT 155 04/18/2022 1023   PLT 186 02/09/2014 1403   MCV 92.8 04/18/2022 1023   MCV 91 02/09/2014 1403   MCH 32.3 04/18/2022 1023   MCHC 34.8 04/18/2022 1023   RDW 13.0 04/18/2022 1023   RDW 14.5 02/09/2014 1403   LYMPHSABS 1.6 02/09/2014 1403   MONOABS 0.4 02/09/2014 1403   EOSABS 0.1 02/09/2014 1403   BASOSABS 0.0 02/09/2014 1403     Chest Imaging:  CTA chest 5 mm right middle lobe pulmonary nodule. The patient's images have been independently reviewed  by me.    Pulmonary Functions Testing Results:     No data to display          FeNO:   Pathology:   Echocardiogram:   Heart Catheterization:     Assessment & Plan:     ICD-10-CM   1. Lung nodule  R91.1 CT Chest Wo Contrast      Discussion:  Seen last year now 67 years old former smoker incidental pulmonary nodule also has a thoracic aneurysm.  Was not noted on his recent CT imaging in April however review of the images realizes that the nodule is still there and was not documented.  He needs repeat follow-up imaging it does appear that has slightly increased in size.  Plan: He will need to go ahead and order a repeat noncontrasted CT chest. We can make plans after we have images. I apologized to the patient for failure of our office to ensure adequate CT image follow-up.    Current Outpatient Medications:    acetaminophen (TYLENOL) 500 MG tablet, Take 500 mg by mouth every 6 (six) hours as needed., Disp: , Rfl:    amLODipine (NORVASC) 5 MG tablet, Take by mouth., Disp: , Rfl:    meloxicam (MOBIC) 15 MG tablet, Take 15 mg by mouth daily., Disp: , Rfl:    olmesartan (BENICAR) 40 MG tablet, Take 40 mg by mouth daily., Disp: , Rfl:    omeprazole (PRILOSEC) 20 MG capsule, Take 20 mg by mouth daily., Disp: , Rfl:    sildenafil (VIAGRA) 100 MG tablet, Take 1 tablet 1 hour prior to intercourse., Disp: 6 tablet, Rfl: 0   tadalafil (CIALIS) 5 MG tablet, Take 1 tablet (5 mg total) by mouth daily as needed for erectile dysfunction., Disp: 30 tablet, Rfl: 3    Elige Radon  Geoffery Spruce, DO [facility] 03/26/2023 1:16 PM

## 2023-04-07 ENCOUNTER — Ambulatory Visit
Admission: RE | Admit: 2023-04-07 | Discharge: 2023-04-07 | Disposition: A | Discharge status: Home / Self Care | Payer: Medicare PPO | Source: Ambulatory Visit | Attending: Family Medicine | Admitting: Family Medicine

## 2023-04-07 DIAGNOSIS — R911 Solitary pulmonary nodule: Secondary | ICD-10-CM | POA: Diagnosis present

## 2023-04-08 ENCOUNTER — Other Ambulatory Visit: Payer: Self-pay | Admitting: Thoracic Surgery (Cardiothoracic Vascular Surgery)

## 2023-04-08 DIAGNOSIS — I7121 Aneurysm of the ascending aorta, without rupture: Secondary | ICD-10-CM

## 2023-04-30 NOTE — Progress Notes (Signed)
Edwin Ruiz,   Please let patient know his nodule appears stable for now but he needs a repeat noncontrasted CT chest in 1 year.   Thanks,  BLI  Josephine Igo, DO [facility] 04/30/2023 4:43 PM

## 2023-05-12 ENCOUNTER — Other Ambulatory Visit: Payer: Self-pay | Admitting: Emergency Medicine

## 2023-05-12 DIAGNOSIS — R911 Solitary pulmonary nodule: Secondary | ICD-10-CM

## 2023-05-14 NOTE — Progress Notes (Addendum)
[address]       Edwin Ruiz [address]             [phone]    PCP is [name], MD Referring Provider is [name], MD Cardiologist: Dr. Darrold Junker  Chief Complaint:Ascending thoracic aortic aneurysm    HPI: This is a 67 year old male with a past medical history of hypertension, PPM, sleep apnea, obesity, GERD, former tobacco abuse, varicose veins, asthma, arthritis, and right middle pulmonary nodule (4 mm, followed by Dr. Tonia Brooms) who was found to have an incidental 4.5 cm ATAA on CTA 03/13/2022. He was last seen by my colleague in April 2024 and the ATAA was stable at 4.5 cm and the right middle lobe pulmonary nodule (this is being followed by Dr. Tonia Brooms) was also stable. He presents today for further surveillance of the above. Patient denies chest pain, pressure or shortness of breath. He sometimes has LE swelling as has varicose veins and sometimes wears compression stockings.  Past Medical History:  Diagnosis Date   Acute cholecystitis due to biliary calculus 03/20/2022   Arthritis    Asthma    GERD (gastroesophageal reflux disease)    Hematuria    Hypertension    Presence of permanent cardiac pacemaker    Sleep apnea    Urticaria     Past Surgical History:  Procedure Laterality Date   APPENDECTOMY     COLONOSCOPY WITH PROPOFOL N/A 02/05/2016   Procedure: COLONOSCOPY WITH PROPOFOL;  Surgeon: Christena Deem, MD;  Location: [facility] ENDOSCOPY;  Service: Endoscopy;  Laterality: N/A;   COLONOSCOPY WITH PROPOFOL N/A 09/03/2022   Procedure: COLONOSCOPY WITH PROPOFOL;  Surgeon: Regis Bill, MD;  Location: [facility] ENDOSCOPY;  Service: Endoscopy;  Laterality: N/A;   FOOT SURGERY     INSERT / REPLACE / REMOVE PACEMAKER     PPM GENERATOR CHANGEOUT N/A 01/16/2022   Procedure: PPM GENERATOR CHANGEOUT;  Surgeon: Marcina Millard, MD;  Location: [facility] INVASIVE CV LAB;  Service: Cardiovascular;  Laterality: N/A;   TONSILLECTOMY      Family  History: Mother deceased at age 72, Asian flu Father deceased at age 89, had AAA but did not die from this Grandparents had history of stroke  Social History Social History   Tobacco Use   Smoking status: Former    Passive exposure: Past   Smokeless tobacco: Never  Vaping Use   Vaping status: Never Used  Substance Use Topics   Alcohol use: Yes    Comment: occasional   Drug use: No    Current Outpatient Medications  Medication Sig Dispense Refill   acetaminophen (TYLENOL) 500 MG tablet Take 500 mg by mouth every 6 (six) hours as needed.     amLODipine (NORVASC) 5 MG tablet Take by mouth.     meloxicam (MOBIC) 15 MG tablet Take 15 mg by mouth daily.     olmesartan (BENICAR) 40 MG tablet Take 40 mg by mouth daily.     omeprazole (PRILOSEC) 20 MG capsule Take 20 mg by mouth daily.     sildenafil (VIAGRA) 100 MG tablet Take 1 tablet 1 hour prior to intercourse. 6 tablet 0   tadalafil (CIALIS) 5 MG tablet Take 1 tablet (5 mg total) by mouth daily as needed for erectile dysfunction. 30 tablet 3   Allergies  Allergen Reactions   Penicillins     Review of Systems:  Chest Pain [ N ] Exertional SOB [ N ]  Pedal Edema [ sometimes right  leg-varicosities ] Syncope [ N ] General Review of Systems: [Y] = yes [ N]=no  Consitutional:   nausea Klaus.Mock ];  fever Klaus.Mock ];  Resp: cough [ N];  hemoptysis[ N];  GI: vomiting[ N]; melena[N ]; hematochezia [N];  ZO:XWRUEAVWU[ N]; Musculoskeletal: joint pain[ Y-both knees ];  Heme/Lymph: anemia[ N];  Neuro: TIA[ N];stroke[N ];  seizures[ N];  Endocrine: diabetes[N ];   Vital Signs: Vitals:   05/19/23 1257  BP: 136/82  Pulse: 78  Resp: 18  SpO2: 94%     Physical Exam: CV-RRR, no murmur Neck-No carotid bruit Pulmonary-Clear to auscultation bilaterally Abdomen-Soft, obese, non tender, bowel sounds present Extremities-Trace RLE edema, varicosities Neurologic-Grossly intact without focal deficit   Diagnostic Tests:  Narrative & Impression   CLINICAL DATA:  Follow-up ascending aortic aneurysm   EXAM: CT ANGIOGRAPHY CHEST WITH CONTRAST   TECHNIQUE: Multidetector CT imaging of the chest was performed using the standard protocol during bolus administration of intravenous contrast. Multiplanar CT image reconstructions and MIPs were obtained to evaluate the vascular anatomy.   RADIATION DOSE REDUCTION: This exam was performed according to the departmental dose-optimization program which includes automated exposure control, adjustment of the mA and/or kV according to patient size and/or use of iterative reconstruction technique.   CONTRAST:  85mL ISOVUE-370 IOPAMIDOL (ISOVUE-370) INJECTION 76%   COMPARISON:  Multiple priors, most recent chest CT dated April 07, 2023   FINDINGS: Cardiovascular: Stable dilated ascending thoracic aorta, measuring up to 4.5 cm. Normal heart size. No pericardial effusion. Mild coronary artery calcifications. Left chest wall dual lead pacer with leads in the right atrium and right ventricle.   Mediastinum/Nodes: Small hiatal hernia. Thyroid is unremarkable. No enlarged lymph nodes seen in the chest.   Lungs/Pleura: Central airways are patent. No consolidation, pleural effusion or pneumothorax. Stable small solid pulmonary nodule of the right middle lobe measuring 3 mm on series 7, image 71, likely benign given greater than 1 year stability.   Upper Abdomen: Simple appearing liver and renal cysts, unchanged when compared with the prior exam, no specific follow-up imaging is necessary.   Musculoskeletal: No chest wall abnormality. No acute or significant osseous findings.   Review of the MIP images confirms the above findings.   IMPRESSION: 1. Stable dilated ascending thoracic aorta, measuring up to 4.5 cm. Ascending thoracic aortic aneurysm. Recommend semi-annual imaging followup by CTA or MRA and referral to cardiothoracic surgery if not already obtained. This recommendation  follows 2010 ACCF/AHA/AATS/ACR/ASA/SCA/SCAI/SIR/STS/SVM Guidelines for the Diagnosis and Management of Patients With Thoracic Aortic Disease. Circulation. 2010; 121: J811-B147. Aortic aneurysm NOS (ICD10-I71.9) 2. Mild coronary artery calcifications.     Electronically Signed   By: Allegra Lai M.D.   On: 05/16/2023 13:53    Impression and Plan: CTA with a 4.5 cm ascending aortic aneurysm.   We discussed the natural history and and risk factors for growth of ascending aortic aneurysms.  We covered the importance of smoking cessation, tight blood pressure control, refraining from lifting heavy objects, and avoiding fluoroquinolones.  The patient is aware of signs and symptoms of aortic dissection and when to present to the emergency department.  We will continue surveillance and a repeat CTA was ordered for 6 months. Of note, he is getting 2 scans as he is being followed by Dr. Tonia Brooms for a pulmonary nodule. I have spoken with Dr. Tonia Brooms and we will obtain one scan in order to have surveillance on both the ATAA and the pulmonary nodule.  Ardelle Balls, PA-C [facility] [phone]

## 2023-05-16 ENCOUNTER — Ambulatory Visit
Admission: RE | Admit: 2023-05-16 | Discharge: 2023-05-16 | Disposition: A | Discharge status: Home / Self Care | Payer: Medicare PPO | Source: Ambulatory Visit | Attending: Thoracic Surgery (Cardiothoracic Vascular Surgery) | Admitting: Thoracic Surgery (Cardiothoracic Vascular Surgery)

## 2023-05-16 DIAGNOSIS — I7121 Aneurysm of the ascending aorta, without rupture: Secondary | ICD-10-CM

## 2023-05-16 MED ORDER — IOPAMIDOL (ISOVUE-370) INJECTION 76%
500.0000 mL | Freq: Once | INTRAVENOUS | Status: AC | PRN
  Administered 2023-05-16: 85 mL via INTRAVENOUS

## 2023-05-19 ENCOUNTER — Ambulatory Visit: Payer: Medicare PPO | Admitting: Physician Assistant

## 2023-05-19 ENCOUNTER — Encounter: Payer: Self-pay | Admitting: Physician Assistant

## 2023-05-19 VITALS — BP 136/82 | HR 78 | Resp 18 | Ht 76.0 in | Wt 395.0 lb

## 2023-05-19 DIAGNOSIS — I7121 Aneurysm of the ascending aorta, without rupture: Secondary | ICD-10-CM

## 2023-05-19 NOTE — Patient Instructions (Addendum)
  Risk Modification in those with ascending thoracic aortic aneurysm:  Continue good control of blood pressure (prefer SBP 130/80 or less)-continue with Amlodipine and Olmesartan  2. Avoid fluoroquinolone antibiotics (I.e Ciprofloxacin, Avelox, Levofloxacin, Ofloxacin).  3.  Use of statin (to decrease cardiovascular risk)-Lipid Panel done April 2024:Total cholesterol 176, Triglyceride 165, HDL 43.9, LDL 99. Will defer to primary physician if/when to initate  4.  Exercise and activity limitations is individualized, but in general, contact sports are to be avoided and one should avoid heavy lifting (defined as half of ideal body weight) and exercises involving sustained Valsalva maneuver.  5. Counseling for those suspected of having genetically mediated disease. First-degree relatives of those with TAA disease should be screened as well as those who have a connective tissue disease (I.e with Marfan syndrome, Ehlers-Danlos syndrome,  and Loeys-Dietz syndrome) or a  bicuspid aortic valve,have an increased risk for complications related to TAA. Per patient, no history of connective tissue disease.  6. He has a remote history of tobacco abuse;quit about 15 years ago.

## 2023-07-19 ENCOUNTER — Other Ambulatory Visit: Payer: Self-pay

## 2023-07-19 ENCOUNTER — Ambulatory Visit
Admission: RE | Admit: 2023-07-19 | Discharge: 2023-07-19 | Disposition: A | Discharge status: Home / Self Care | Payer: Medicare PPO | Source: Ambulatory Visit | Attending: Emergency Medicine | Admitting: Emergency Medicine

## 2023-07-19 VITALS — BP 129/82 | HR 2 | Temp 98.7°F | Resp 20

## 2023-07-19 DIAGNOSIS — J0140 Acute pansinusitis, unspecified: Secondary | ICD-10-CM

## 2023-07-19 MED ORDER — DOXYCYCLINE HYCLATE 100 MG PO CAPS: 100.0000 mg | ORAL_CAPSULE | Freq: Two times a day (BID) | ORAL | 0 refills | Status: DC

## 2023-07-19 MED ORDER — PREDNISONE 20 MG PO TABS: 40.0000 mg | ORAL_TABLET | Freq: Every day | ORAL | 0 refills | Status: DC

## 2023-07-19 NOTE — ED Provider Notes (Signed)
Renaldo Fiddler    CSN: [id number] Arrival date & time: 07/19/23  1106      History   Chief Complaint Chief Complaint  Patient presents with   Facial Pain   Dental Pain   Sore Throat    HPI Edwin Ruiz is a 67 y.o. male.   Patient presents for evaluation of nasal congestion, rhinorrhea and a nonproductive cough present for 3 weeks, over the past 3 to 4 days has begun to experience intense right sided facial pain and pressure to the forehead cheeks and behind the eyes.  Associated right ear pain and intermittent generalized headaches.  Last week experiencing tongue soreness which has improved.  Initial accompanying fever has resolved.  Experiencing shortness of breath and wheezing at baseline, history of asthma, has been using inhaler to manage.  Tolerating food and liquids.  Has attempted gargling salt water, Tylenol  Past Medical History:  Diagnosis Date   Acute cholecystitis due to biliary calculus 03/20/2022   Arthritis    Asthma    GERD (gastroesophageal reflux disease)    Hematuria    Hypertension    Presence of permanent cardiac pacemaker    Sleep apnea    Urticaria     Patient Active Problem List   Diagnosis Date Noted   Status post laparoscopic cholecystectomy 04/18/2022   Severe obesity (BMI >= 40) (HCC) 04/18/2022   Aneurysm of ascending aorta without rupture (HCC) 03/19/2022   Solitary pulmonary nodule 03/19/2022    Past Surgical History:  Procedure Laterality Date   APPENDECTOMY     COLONOSCOPY WITH PROPOFOL N/A 02/05/2016   Procedure: COLONOSCOPY WITH PROPOFOL;  Surgeon: Christena Deem, MD;  Location: [facility] ENDOSCOPY;  Service: Endoscopy;  Laterality: N/A;   COLONOSCOPY WITH PROPOFOL N/A 09/03/2022   Procedure: COLONOSCOPY WITH PROPOFOL;  Surgeon: Regis Bill, MD;  Location: [facility] ENDOSCOPY;  Service: Endoscopy;  Laterality: N/A;   FOOT SURGERY     INSERT / REPLACE / REMOVE PACEMAKER     PPM GENERATOR CHANGEOUT N/A 01/16/2022    Procedure: PPM GENERATOR CHANGEOUT;  Surgeon: Marcina Millard, MD;  Location: [facility] INVASIVE CV LAB;  Service: Cardiovascular;  Laterality: N/A;   TONSILLECTOMY         Home Medications    Prior to Admission medications   Medication Sig Start Date End Date Taking? Authorizing Provider  acetaminophen (TYLENOL) 500 MG tablet Take 500 mg by mouth every 6 (six) hours as needed.   Yes [provider]  amLODipine (NORVASC) 5 MG tablet Take by mouth. 12/05/22 12/05/23 Yes [provider]  cetirizine (ZYRTEC) 10 MG tablet Take 1 tablet by mouth daily. 03/03/23  Yes [provider]  doxycycline (VIBRAMYCIN) 100 MG capsule Take 1 capsule (100 mg total) by mouth 2 (two) times daily. 07/19/23  Yes Kila Godina R, NP  meloxicam (MOBIC) 15 MG tablet Take 15 mg by mouth daily.   Yes [provider]  olmesartan (BENICAR) 40 MG tablet Take 40 mg by mouth daily. 02/09/23  Yes [provider]  omeprazole (PRILOSEC) 20 MG capsule Take 20 mg by mouth daily.   Yes [provider]  predniSONE (DELTASONE) 20 MG tablet Take 2 tablets (40 mg total) by mouth daily. 07/19/23  Yes Kaela Beitz R, NP  sildenafil (VIAGRA) 100 MG tablet Take 1 tablet 1 hour prior to intercourse. 01/02/23   Stoioff, Verna Czech, MD  tadalafil (CIALIS) 5 MG tablet Take 1 tablet (5 mg total) by mouth daily as needed for  erectile dysfunction. 01/02/23   Riki Altes, MD    Family History History reviewed. No pertinent family history.  Social History Social History   Tobacco Use   Smoking status: Former    Passive exposure: Past   Smokeless tobacco: Never  Vaping Use   Vaping status: Never Used  Substance Use Topics   Alcohol use: Yes    Comment: occasional   Drug use: No     Allergies   Penicillins   Review of Systems Review of Systems   Physical Exam Triage Vital Signs ED Triage Vitals  Encounter Vitals Group     BP 07/19/23 1147 129/82     Systolic BP  Percentile --      Diastolic BP Percentile --      Pulse Rate 07/19/23 1147 (!) 2     Resp 07/19/23 1147 20     Temp 07/19/23 1147 98.7 F (37.1 C)     Temp src --      SpO2 07/19/23 1147 94 %     Weight --      Height --      Head Circumference --      Peak Flow --      Pain Score 07/19/23 1144 5     Pain Loc --      Pain Education --      Exclude from Growth Chart --    No data found.  Updated Vital Signs BP 129/82   Pulse (!) 2   Temp 98.7 F (37.1 C)   Resp 20   SpO2 94%   Visual Acuity Right Eye Distance:   Left Eye Distance:   Bilateral Distance:    Right Eye Near:   Left Eye Near:    Bilateral Near:     Physical Exam Constitutional:      Appearance: Normal appearance.  HENT:     Head: Normocephalic.     Right Ear: Tympanic membrane, ear canal and external ear normal.     Left Ear: Tympanic membrane, ear canal and external ear normal.     Nose: Congestion present. No rhinorrhea.     Right Sinus: Maxillary sinus tenderness and frontal sinus tenderness present.     Left Sinus: No maxillary sinus tenderness or frontal sinus tenderness.     Mouth/Throat:     Mouth: Mucous membranes are moist.     Pharynx: No oropharyngeal exudate or posterior oropharyngeal erythema.  Eyes:     Extraocular Movements: Extraocular movements intact.  Cardiovascular:     Rate and Rhythm: Normal rate and regular rhythm.     Pulses: Normal pulses.     Heart sounds: Normal heart sounds.  Pulmonary:     Effort: Pulmonary effort is normal.     Breath sounds: Normal breath sounds.  Musculoskeletal:     Cervical back: Normal range of motion and neck supple.  Neurological:     Mental Status: He is alert and oriented to person, place, and time. Mental status is at baseline.      UC Treatments / Results  Labs (all labs ordered are listed, but only abnormal results are displayed) Labs Reviewed - No data to display  EKG   Radiology No results  found.  Procedures Procedures (including critical care time)  Medications Ordered in UC Medications - No data to display  Initial Impression / Assessment and Plan / UC Course  I have reviewed the triage vital signs and the nursing notes.  Pertinent labs & imaging  results that were available during my care of the patient were reviewed by me and considered in my medical decision making (see chart for details).  Acute nonrecurrent pansinusitis  Patient is in no signs of distress nor toxic appearing.  Vital signs are stable.  Low suspicion for pneumonia, pneumothorax or bronchitis and therefore will defer imaging.  Patient is symptomology is consistent with a sinusitis and as symptoms initially began 3 weeks ago we will provide bacterial coverage.  Prescribed doxycycline and prednisone to help reduce sinus pressure.May use additional over-the-counter medications as needed for supportive care.  May follow-up with urgent care as needed if symptoms persist or worsen.   Final Clinical Impressions(s) / UC Diagnoses   Final diagnoses:  Acute non-recurrent pansinusitis     Discharge Instructions      You are being treated for sinus infection  Take doxycycline every morning and every evening for 10 days to clear germs contributing to your symptoms  Begin prednisone every morning with food for 5 days to help reduce sinus pressure and pain  You can take Tylenol as needed for fever reduction and pain relief.   For cough: honey 1/2 to 1 teaspoon (you can dilute the honey in water or another fluid).  You can also use guaifenesin and dextromethorphan for cough. You can use a humidifier for chest congestion and cough.  If you don't have a humidifier, you can sit in the bathroom with the hot shower running.      For sore throat: try warm salt water gargles, cepacol lozenges, throat spray, warm tea or water with lemon/honey, popsicles or ice, or OTC cold relief medicine for throat discomfort.    For congestion: take a daily anti-histamine like Zyrtec, Claritin, and a oral decongestant, such as pseudoephedrine.  You can also use Flonase 1-2 sprays in each nostril daily.   It is important to stay hydrated: drink plenty of fluids (water, gatorade/powerade/pedialyte, juices, or teas) to keep your throat moisturized and help further relieve irritation/discomfort.    ED Prescriptions     Medication Sig Dispense Auth. Provider   doxycycline (VIBRAMYCIN) 100 MG capsule Take 1 capsule (100 mg total) by mouth 2 (two) times daily. 20 capsule Nora Rooke R, NP   predniSONE (DELTASONE) 20 MG tablet Take 2 tablets (40 mg total) by mouth daily. 10 tablet Valinda Hoar, NP      PDMP not reviewed this encounter.   Valinda Hoar, NP 07/19/23 1245

## 2023-07-19 NOTE — Discharge Instructions (Addendum)
You are being treated for sinus infection  Take doxycycline every morning and every evening for 10 days to clear germs contributing to your symptoms  Begin prednisone every morning with food for 5 days to help reduce sinus pressure and pain  You can take Tylenol as needed for fever reduction and pain relief.   For cough: honey 1/2 to 1 teaspoon (you can dilute the honey in water or another fluid).  You can also use guaifenesin and dextromethorphan for cough. You can use a humidifier for chest congestion and cough.  If you don't have a humidifier, you can sit in the bathroom with the hot shower running.      For sore throat: try warm salt water gargles, cepacol lozenges, throat spray, warm tea or water with lemon/honey, popsicles or ice, or OTC cold relief medicine for throat discomfort.   For congestion: take a daily anti-histamine like Zyrtec, Claritin, and a oral decongestant, such as pseudoephedrine.  You can also use Flonase 1-2 sprays in each nostril daily.   It is important to stay hydrated: drink plenty of fluids (water, gatorade/powerade/pedialyte, juices, or teas) to keep your throat moisturized and help further relieve irritation/discomfort.

## 2023-07-19 NOTE — ED Triage Notes (Signed)
Pt reports since Thanksgiving  he has had cold. Pt now has a sore tongue and pain to RT side of face.

## 2023-09-15 ENCOUNTER — Other Ambulatory Visit: Payer: Self-pay | Admitting: Urology

## 2023-10-03 ENCOUNTER — Other Ambulatory Visit: Payer: Self-pay | Admitting: Thoracic Surgery (Cardiothoracic Vascular Surgery)

## 2023-10-03 DIAGNOSIS — I7121 Aneurysm of the ascending aorta, without rupture: Secondary | ICD-10-CM

## 2023-10-31 NOTE — Progress Notes (Unsigned)
 [address]       [place] [address]             [phone]        Edwin Ruiz [id number] Dec 16, 1955  History of Present Illness: Edwin Ruiz is a 68 year old male with a past medical history of hypertension, PPM, sleep apnea, obesity, GERD, former tobacco abuse, varicose veins, asthma, arthritis, and right middle pulmonary nodule (4 mm, followed by Dr. Thelda Finney) who was incidentally found to have a 4.5 cm ascending thoracic aortic aneurysm on CTA on 03/13/2022. Last year the aneurysm remained stable measuring 4.5cm and the pulmonary nodule had also remained stable.   He denies family history of ATAA and personal history of connective tissue disorder  Today he reports ****   Current Outpatient Medications on File Prior to Visit  Medication Sig Dispense Refill   acetaminophen (TYLENOL) 500 MG tablet Take 500 mg by mouth every 6 (six) hours as needed.     amLODipine (NORVASC) 5 MG tablet Take by mouth.     cetirizine (ZYRTEC) 10 MG tablet Take 1 tablet by mouth daily.     doxycycline (VIBRAMYCIN) 100 MG capsule Take 1 capsule (100 mg total) by mouth 2 (two) times daily. 20 capsule 0   meloxicam (MOBIC) 15 MG tablet Take 15 mg by mouth daily.     olmesartan (BENICAR) 40 MG tablet Take 40 mg by mouth daily.     omeprazole (PRILOSEC) 20 MG capsule Take 20 mg by mouth daily.     predniSONE (DELTASONE) 20 MG tablet Take 2 tablets (40 mg total) by mouth daily. 10 tablet 0   sildenafil (VIAGRA) 100 MG tablet Take 1 tablet 1 hour prior to intercourse. 6 tablet 0   tadalafil (CIALIS) 5 MG tablet TAKE 1 TABLET (5 MG TOTAL) BY MOUTH DAILY AS NEEDED FOR ERECTILE DYSFUNCTION 30 tablet 3   No current facility-administered medications on file prior to visit.   Vitals:  Physical Exam General Neuro CV Pulm GI Extremities  CTA Results: CLINICAL DATA:  Aortic aneurysm follow-up   EXAM: CT ANGIOGRAPHY CHEST WITH CONTRAST   TECHNIQUE: Multidetector CT imaging of  the chest was performed using the standard protocol during bolus administration of intravenous contrast. Multiplanar CT image reconstructions and MIPs were obtained to evaluate the vascular anatomy.   RADIATION DOSE REDUCTION: This exam was performed according to the departmental dose-optimization program which includes automated exposure control, adjustment of the mA and/or kV according to patient size and/or use of iterative reconstruction technique.   CONTRAST:  75mL ISOVUE-370 IOPAMIDOL (ISOVUE-370) INJECTION 76%   COMPARISON:  Prior CTA of the chest 05/17/2023   FINDINGS: Cardiovascular: Minimal aneurysmal dilation of the ascending thoracic aorta with maximal diameter of 4.3 cm. No evidence of aneurysm or dissection. Conventional 3 vessel arch anatomy. Atherosclerotic calcifications are present in the coronary arteries. A there cardiac rhythm maintenance device is present with leads terminating in the right atrium and right ventricle. The heart is normal in size. No pericardial effusion. Unremarkable pulmonary arteries.   Mediastinum/Nodes: Unremarkable CT appearance of the thyroid gland. No suspicious mediastinal or hilar adenopathy. No soft tissue mediastinal mass. Small hiatal hernia.   Lungs/Pleura: Lungs are clear. No pleural effusion or pneumothorax.   Upper Abdomen: Stable simple cyst in hepatic segment 2. Partially imaged simple cyst exophytic from the medial upper pole of the right kidney. No imaging follow-up is recommended. No acute abnormality.   Musculoskeletal: No acute fracture  or aggressive appearing lytic or blastic osseous lesion. Flowing anterior osteophytes along the visualized thoracic and lumbar spine consistent with dystrophic idiopathic skeletal hyperostosis.   Review of the MIP images confirms the above findings.   IMPRESSION: 1. Minimal aneurysmal dilation of the ascending thoracic aorta with a maximal diameter of approximately 4.3 cm. Prior  larger measurements measuring up to 4.5 cm the may have been exaggerated by underlying cardiac motion artifact which blurs the margins of the thoracic aorta. Recommend annual imaging followup by CTA or MRA. This recommendation follows 2010 ACCF/AHA/AATS/ACR/ASA/SCA/SCAI/SIR/STS/SVM Guidelines for the Diagnosis and Management of Patients with Thoracic Aortic Disease. Circulation. 2010; 121: H086-V784. 2. Coronary artery atherosclerotic vascular calcifications. 3. Small hiatal hernia. 4. Dystrophic idiopathic skeletal hyperostosis (DISH).   Aortic aneurysm NOS (ICD10-I71.9)     Electronically Signed   By: Fernando Hoyer M.D.   On: 11/06/2023 12:59      Impression and Plan: ATAA: Mr. Boer presents to the clinic with a stable 4.3cm ascending aortic aneurysm.  Echocardiogram shows a *** valve without evidence of regurgitation.  We discussed the natural history and and risk factors for growth of ascending aortic aneurysms.  We covered the importance of smoking cessation, tight blood pressure control, refraining from lifting heavy objects, and avoiding fluoroquinolones.  The patient is aware of signs and symptoms of aortic dissection and when to present to the emergency department.  He does not meet surgical criteria of 5.5cm at this time. We will continue surveillance and a repeat ** was ordered for ***.  Coronary artery calcifications: Follow up with PCP  DISH: Follow up with PCP   Risk Modification:  Statin:  ***  Smoking cessation instruction/counseling given:  {CHL AMB [facility] SMOKING CESSATION COUNSELING:20758}  Patient was counseled on importance of Blood Pressure Control.  Despite Medical intervention if the patient notices persistently elevated blood pressure readings.  They are instructed to contact their Primary Care Physician  Please avoid use of Fluoroquinolones as this can potentially increase your risk of Aortic Rupture and/or Dissection  Patient educated on signs  and symptoms of Aortic Dissection, handout also provided in AVS  Randa Burton, PA-C 10/31/23

## 2023-10-31 NOTE — Patient Instructions (Addendum)
 Risk Modification in those with ascending thoracic aortic aneurysm:  Continue good control of blood pressure (prefer SBP 130/80 or less)  2. Avoid fluoroquinolone antibiotics (I.e Ciprofloxacin, Avelox, Levofloxacin, Ofloxacin)  3.  Use of statin (to decrease cardiovascular risk)  4.  Exercise and activity limitations is individualized, but in general, contact sports are to be  avoided and one should avoid heavy lifting (defined as half of ideal body weight) and exercises involving sustained Valsalva maneuver.  5. Counseling for those suspected of having genetically mediated disease. First-degree relatives of those with TAA disease should be screened as well as those who have a connective tissue disease (I.e with Marfan syndrome, Ehlers-Danlos syndrome,  and Loeys-Dietz syndrome) or a  bicuspid aortic valve,have an increased risk for  complications related to TAA. He/She denies family history of TAA and personal history of connective tissue disorder.   6. If one has tobacco abuse, smoking cessation is highly encouraged.  Please contact PCP regarding dystrophic idiopathic skeletal hyperostosis (DISH) seen on CT

## 2023-11-05 ENCOUNTER — Encounter: Payer: Self-pay | Admitting: Thoracic Surgery (Cardiothoracic Vascular Surgery)

## 2023-11-06 ENCOUNTER — Ambulatory Visit
Admission: RE | Admit: 2023-11-06 | Discharge: 2023-11-06 | Discharge status: Home / Self Care | Source: Ambulatory Visit | Attending: Thoracic Surgery (Cardiothoracic Vascular Surgery) | Admitting: Thoracic Surgery (Cardiothoracic Vascular Surgery)

## 2023-11-06 DIAGNOSIS — I7121 Aneurysm of the ascending aorta, without rupture: Secondary | ICD-10-CM

## 2023-11-06 MED ORDER — IOPAMIDOL (ISOVUE-370) INJECTION 76%
75.0000 mL | Freq: Once | INTRAVENOUS | Status: AC | PRN
  Administered 2023-11-06: 75 mL via INTRAVENOUS

## 2023-11-13 ENCOUNTER — Ambulatory Visit: Admitting: Physician Assistant

## 2023-11-13 VITALS — BP 147/79 | HR 75 | Resp 20 | Ht 76.0 in | Wt 397.4 lb

## 2023-11-13 DIAGNOSIS — I7121 Aneurysm of the ascending aorta, without rupture: Secondary | ICD-10-CM

## 2024-01-18 ENCOUNTER — Emergency Department

## 2024-01-18 ENCOUNTER — Emergency Department
Admission: EM | Admit: 2024-01-18 | Discharge: 2024-01-18 | Disposition: A | Discharge status: Home / Self Care | Attending: Emergency Medicine | Admitting: Emergency Medicine

## 2024-01-18 ENCOUNTER — Other Ambulatory Visit: Payer: Self-pay

## 2024-01-18 ENCOUNTER — Encounter: Payer: Self-pay | Admitting: Emergency Medicine

## 2024-01-18 DIAGNOSIS — I959 Hypotension, unspecified: Secondary | ICD-10-CM | POA: Diagnosis not present

## 2024-01-18 DIAGNOSIS — J45909 Unspecified asthma, uncomplicated: Secondary | ICD-10-CM | POA: Insufficient documentation

## 2024-01-18 DIAGNOSIS — I10 Essential (primary) hypertension: Secondary | ICD-10-CM | POA: Diagnosis not present

## 2024-01-18 DIAGNOSIS — Z950 Presence of cardiac pacemaker: Secondary | ICD-10-CM | POA: Diagnosis not present

## 2024-01-18 DIAGNOSIS — R0602 Shortness of breath: Secondary | ICD-10-CM

## 2024-01-18 DIAGNOSIS — R42 Dizziness and giddiness: Secondary | ICD-10-CM

## 2024-01-18 LAB — CBC
HCT: 36.2 % — ABNORMAL LOW (ref 39.0–52.0)
Hemoglobin: 13.2 g/dL (ref 13.0–17.0)
MCH: 33.2 pg (ref 26.0–34.0)
MCHC: 36.5 g/dL — ABNORMAL HIGH (ref 30.0–36.0)
MCV: 91 fL (ref 80.0–100.0)
Platelets: 215 10*3/uL (ref 150–400)
RBC: 3.98 MIL/uL — ABNORMAL LOW (ref 4.22–5.81)
RDW: 13.3 % (ref 11.5–15.5)
WBC: 7.7 10*3/uL (ref 4.0–10.5)
nRBC: 0 % (ref 0.0–0.2)

## 2024-01-18 LAB — BASIC METABOLIC PANEL WITH GFR
Anion gap: 12 (ref 5–15)
BUN: 26 mg/dL — ABNORMAL HIGH (ref 8–23)
CO2: 21 mmol/L — ABNORMAL LOW (ref 22–32)
Calcium: 9.4 mg/dL (ref 8.9–10.3)
Chloride: 98 mmol/L (ref 98–111)
Creatinine, Ser: 1.58 mg/dL — ABNORMAL HIGH (ref 0.61–1.24)
GFR, Estimated: 48 mL/min — ABNORMAL LOW (ref >60)
Glucose, Bld: 100 mg/dL — ABNORMAL HIGH (ref 70–99)
Potassium: 4.1 mmol/L (ref 3.5–5.1)
Sodium: 131 mmol/L — ABNORMAL LOW (ref 135–145)

## 2024-01-18 LAB — URINALYSIS, ROUTINE W REFLEX MICROSCOPIC
Bilirubin Urine: NEGATIVE
Glucose, UA: NEGATIVE mg/dL
Hgb urine dipstick: NEGATIVE
Ketones, ur: 5 mg/dL — AB
Leukocytes,Ua: NEGATIVE
Nitrite: NEGATIVE
Protein, ur: NEGATIVE mg/dL
Specific Gravity, Urine: 1.009 (ref 1.005–1.030)
pH: 5 (ref 5.0–8.0)

## 2024-01-18 LAB — D-DIMER, QUANTITATIVE: D-Dimer, Quant: 0.32 ug{FEU}/mL (ref 0.00–0.50)

## 2024-01-18 MED ORDER — SODIUM CHLORIDE 0.9 % IV BOLUS
1000.0000 mL | Freq: Once | INTRAVENOUS | Status: AC
Start: 2024-01-18 — End: 2024-01-18
  Administered 2024-01-18: 1000 mL via INTRAVENOUS

## 2024-01-18 NOTE — Discharge Instructions (Signed)
 Please check your blood pressure in the mornings and in the evenings and follow-up with your cardiologist as planned in the next couple of days for any potential adjustments to your medication.  Your kidney function is also slightly worse than normal so you should follow-up with your regular doctor or a kidney doctor like nephrologist as needed for continued outpatient monitoring and assessment.  Please return for any recurrent or worsening symptoms.

## 2024-01-18 NOTE — ED Provider Notes (Signed)
 [facility] Provider Note    Event Date/Time   First MD Initiated Contact with Patient 01/18/24 2025     (approximate)   History   Shortness of Breath   HPI Edwin Ruiz is a 68 y.o. male with history of asthma, HTN, HLD, pacemaker presenting today for shortness of breath and lightheadedness.  Patient states he was outside doing yard work and when he came in he started to feel short of breath and lightheaded.  Denies passing out.  Denies chest pain, nausea, vomiting, diaphoresis.  Thought it may have been related to his asthma and was using an inhaler without significant change in symptoms.  Denies any leg pain or leg swelling.  Currently in the ER states he mostly feels asymptomatic now at this time.  No other acute symptoms.  He does note recent increase in his home blood pressure medication but also has started on a diet and has had an 18 pound weight loss in the past 2 weeks which is intentional.     Physical Exam   Triage Vital Signs: ED Triage Vitals  Encounter Vitals Group     BP 01/18/24 1821 (!) 106/46     Girls Systolic BP Percentile --      Girls Diastolic BP Percentile --      Boys Systolic BP Percentile --      Boys Diastolic BP Percentile --      Pulse Rate 01/18/24 1821 72     Resp 01/18/24 1821 16     Temp 01/18/24 1821 98.4 F (36.9 C)     Temp Source 01/18/24 1821 Oral     SpO2 01/18/24 1821 98 %     Weight 01/18/24 1822 (!) 370 lb (167.8 kg)     Height 01/18/24 1822 6' 4 (1.93 m)     Head Circumference --      Peak Flow --      Pain Score 01/18/24 1822 0     Pain Loc --      Pain Education --      Exclude from Growth Chart --     Most recent vital signs: Vitals:   01/18/24 1821 01/18/24 2125  BP: (!) 106/46 (!) 105/58  Pulse: 72 (!) 58  Resp: 16 16  Temp: 98.4 F (36.9 C) 98.2 F (36.8 C)  SpO2: 98% 94%   Physical Exam: I have reviewed the vital signs and nursing notes. General: Awake, alert, no acute distress.   Nontoxic appearing. Head:  Atraumatic, normocephalic.   ENT:  EOM intact, PERRL. Oral mucosa is pink and moist with no lesions. Neck: Neck is supple with full range of motion, No meningeal signs. Cardiovascular:  RRR, No murmurs. Peripheral pulses palpable and equal bilaterally. Respiratory:  Symmetrical chest wall expansion.  No rhonchi, rales, or wheezes.  Good air movement throughout.  No use of accessory muscles.   Musculoskeletal:  No cyanosis or edema. Moving extremities with full ROM Abdomen:  Soft, nontender, nondistended. Neuro:  GCS 15, moving all four extremities, interacting appropriately. Speech clear. Psych:  Calm, appropriate.   Skin:  Warm, dry, no rash.    ED Results / Procedures / Treatments   Labs (all labs ordered are listed, but only abnormal results are displayed) Labs Reviewed  BASIC METABOLIC PANEL WITH GFR - Abnormal; Notable for the following components:      Result Value   Sodium 131 (*)    CO2 21 (*)    Glucose, Bld 100 (*)  BUN 26 (*)    Creatinine, Ser 1.58 (*)    GFR, Estimated 48 (*)    All other components within normal limits  CBC - Abnormal; Notable for the following components:   RBC 3.98 (*)    HCT 36.2 (*)    MCHC 36.5 (*)    All other components within normal limits  URINALYSIS, ROUTINE W REFLEX MICROSCOPIC - Abnormal; Notable for the following components:   Color, Urine YELLOW (*)    APPearance CLEAR (*)    Ketones, ur 5 (*)    All other components within normal limits  D-DIMER, QUANTITATIVE     EKG My EKG interpretation: Rate of 91, ventricular paced rhythm.  No acute ST elevations or depressions   RADIOLOGY Independently interpreted chest x-ray with no acute pathology   PROCEDURES:  Critical Care performed: No  Procedures   MEDICATIONS ORDERED IN ED: Medications  sodium chloride  0.9 % bolus 1,000 mL (0 mLs Intravenous Stopped 01/18/24 2224)     IMPRESSION / MDM / ASSESSMENT AND PLAN / ED COURSE  I reviewed the  triage vital signs and the nursing notes.                              Differential diagnosis includes, but is not limited to, dehydration, electrolyte abnormality, vasovagal syncope, orthostatic hypotension, medication induced hypotension, asthma, pneumonia  Patient's presentation is most consistent with acute complicated illness / injury requiring diagnostic workup.  Patient is a 68 year old male presenting today for shortness of breath and lightheaded episode.  Did not truly pass out.  Here in the ED on my exam, he is largely asymptomatic.  Blood pressure on arrival is 106/46.  Does note recent increase in his home blood pressure medication but also was on a diet and lost 18 pounds in 2 weeks which was intentional.  Suspect it may be a combination of weight loss and blood pressure regimen along with the hot weather which may have contributed to his symptoms today.  EKG unremarkable.  CBC reassuring.  BMP shows new AKI with creatinine of 1.58 indicating dehydration may be playing a factor as well.  Given 1 L of fluid.  Patient continues to be asymptomatic.  D-dimer negative.  Chest x-ray unremarkable.  Suspect his shortness of breath during this episode was likely related to hypotension with near syncope.  Given that he is continued to be asymptomatic here with no other acute findings, patient can be safely discharged and follow-up with his PCP.  Recommend he hold his nightly blood pressure medication tonight and check this twice daily until he follows up with his cardiologist for ongoing management.  They are agreeable with plan and given strict return precautions.  The patient is on the cardiac monitor to evaluate for evidence of arrhythmia and/or significant heart rate changes.     FINAL CLINICAL IMPRESSION(S) / ED DIAGNOSES   Final diagnoses:  Lightheadedness  Hypotension, unspecified hypotension type  Shortness of breath     Rx / DC Orders   ED Discharge Orders     None         Note:  This document was prepared using Dragon voice recognition software and may include unintentional dictation errors.   Malvina Alm DASEN, MD 01/18/24 2225

## 2024-01-18 NOTE — ED Triage Notes (Signed)
 Pt via POV c/o SOB after doing some yard work outside. Pt has hx asthma, PE, pacer, and HTN; used inhaler at home PTA with no relief. Denies CP; no n/v.

## 2024-01-18 NOTE — ED Notes (Signed)
 MD at bedside.

## 2024-01-18 NOTE — ED Notes (Signed)
 Pt verbalizes understanding of discharge instructions.

## 2024-02-03 ENCOUNTER — Other Ambulatory Visit: Payer: Self-pay | Admitting: Nephrology

## 2024-02-03 DIAGNOSIS — N1831 Chronic kidney disease, stage 3a: Secondary | ICD-10-CM

## 2024-02-03 DIAGNOSIS — E785 Hyperlipidemia, unspecified: Secondary | ICD-10-CM

## 2024-02-03 DIAGNOSIS — I10 Essential (primary) hypertension: Secondary | ICD-10-CM

## 2024-02-12 ENCOUNTER — Ambulatory Visit
Admission: RE | Admit: 2024-02-12 | Discharge: 2024-02-12 | Disposition: A | Discharge status: Home / Self Care | Source: Ambulatory Visit | Attending: Nephrology | Admitting: Nephrology

## 2024-02-12 DIAGNOSIS — N1831 Chronic kidney disease, stage 3a: Secondary | ICD-10-CM | POA: Insufficient documentation

## 2024-02-12 DIAGNOSIS — E785 Hyperlipidemia, unspecified: Secondary | ICD-10-CM | POA: Insufficient documentation

## 2024-02-12 DIAGNOSIS — I10 Essential (primary) hypertension: Secondary | ICD-10-CM | POA: Diagnosis present

## 2024-02-23 ENCOUNTER — Other Ambulatory Visit: Payer: Self-pay | Admitting: Nephrology

## 2024-02-23 DIAGNOSIS — N2889 Other specified disorders of kidney and ureter: Secondary | ICD-10-CM

## 2024-02-23 DIAGNOSIS — N1831 Chronic kidney disease, stage 3a: Secondary | ICD-10-CM

## 2024-02-23 DIAGNOSIS — N289 Disorder of kidney and ureter, unspecified: Secondary | ICD-10-CM

## 2024-02-27 ENCOUNTER — Ambulatory Visit
Admission: RE | Admit: 2024-02-27 | Discharge: 2024-02-27 | Disposition: A | Discharge status: Home / Self Care | Source: Ambulatory Visit | Attending: Nephrology | Admitting: Nephrology

## 2024-02-27 DIAGNOSIS — N1831 Chronic kidney disease, stage 3a: Secondary | ICD-10-CM | POA: Diagnosis present

## 2024-02-27 DIAGNOSIS — N2889 Other specified disorders of kidney and ureter: Secondary | ICD-10-CM | POA: Insufficient documentation

## 2024-02-27 DIAGNOSIS — N289 Disorder of kidney and ureter, unspecified: Secondary | ICD-10-CM | POA: Insufficient documentation

## 2024-03-05 ENCOUNTER — Ambulatory Visit
Admission: EM | Admit: 2024-03-05 | Discharge: 2024-03-05 | Disposition: A | Discharge status: Home / Self Care | Attending: Emergency Medicine | Admitting: Emergency Medicine

## 2024-03-05 DIAGNOSIS — S0501XA Injury of conjunctiva and corneal abrasion without foreign body, right eye, initial encounter: Secondary | ICD-10-CM

## 2024-03-05 MED ORDER — ERYTHROMYCIN 5 MG/GM OP OINT: TOPICAL_OINTMENT | OPHTHALMIC | 0 refills | Status: DC

## 2024-03-05 MED ORDER — ERYTHROMYCIN 5 MG/GM OP OINT
TOPICAL_OINTMENT | OPHTHALMIC | 0 refills | Status: DC
Start: 2024-03-05 — End: 2024-04-20

## 2024-03-05 NOTE — Discharge Instructions (Addendum)
Use the antibiotic eyedrops as prescribed.    Follow-up with your eye care provider on Monday.    Go to the emergency department if you have acute eye pain, changes in your vision, or other concerning symptoms.

## 2024-03-05 NOTE — ED Triage Notes (Signed)
 Patient to Urgent Care with complaints of a right eye injury. Eye is very watery/ irritated.   Reports he was mowing this afternoon and he ran into a tree limb.   Wife flushed eye with saline.

## 2024-03-05 NOTE — ED Provider Notes (Signed)
 Edwin Ruiz    CSN: [id number] Arrival date & time: 03/05/24  1903      History   Chief Complaint Chief Complaint  Patient presents with   Eye Problem    HPI Edwin Ruiz is a 68 y.o. male.  Accompanied by his wife, patient presents with right eye redness, irritation, watering, photosensitivity after he accidentally hit his eye with a tree limb while mowing this afternoon.  Treatment attempted with saline flush.  Patient reports no purulent drainage, fever, change in vision.  The history is provided by the patient, the spouse and medical records.    Past Medical History:  Diagnosis Date   Acute cholecystitis due to biliary calculus 03/20/2022   Arthritis    Asthma    GERD (gastroesophageal reflux disease)    Hematuria    Hypertension    Presence of permanent cardiac pacemaker    Sleep apnea    Urticaria     Patient Active Problem List   Diagnosis Date Noted   Status post laparoscopic cholecystectomy 04/18/2022   Severe obesity (BMI >= 40) (HCC) 04/18/2022   Aneurysm of ascending aorta without rupture (HCC) 03/19/2022   Solitary pulmonary nodule 03/19/2022    Past Surgical History:  Procedure Laterality Date   APPENDECTOMY     COLONOSCOPY WITH PROPOFOL  N/A 02/05/2016   Procedure: COLONOSCOPY WITH PROPOFOL ;  Surgeon: Gladis RAYMOND Mariner, MD;  Location: [facility] ENDOSCOPY;  Service: Endoscopy;  Laterality: N/A;   COLONOSCOPY WITH PROPOFOL  N/A 09/03/2022   Procedure: COLONOSCOPY WITH PROPOFOL ;  Surgeon: Maryruth Ole DASEN, MD;  Location: [facility] ENDOSCOPY;  Service: Endoscopy;  Laterality: N/A;   FOOT SURGERY     INSERT / REPLACE / REMOVE PACEMAKER     PPM GENERATOR CHANGEOUT N/A 01/16/2022   Procedure: PPM GENERATOR CHANGEOUT;  Surgeon: Ammon Blunt, MD;  Location: [facility] INVASIVE CV LAB;  Service: Cardiovascular;  Laterality: N/A;   TONSILLECTOMY         Home Medications    Prior to Admission medications   Medication Sig Start Date End Date Taking?  Authorizing Provider  acetaminophen  (TYLENOL ) 500 MG tablet Take 500 mg by mouth every 6 (six) hours as needed.    [provider]  amLODipine (NORVASC) 5 MG tablet Take by mouth. 12/05/22 12/05/23  [provider]  cetirizine (ZYRTEC) 10 MG tablet Take 1 tablet by mouth daily. 03/03/23   [provider]  erythromycin  ophthalmic ointment Place a 1/2 inch ribbon of ointment into the lower eyelid four times a day for 7 days. 03/05/24   Corlis Burnard DEL, NP  meloxicam  (MOBIC ) 15 MG tablet Take 15 mg by mouth daily.    [provider]  olmesartan (BENICAR) 40 MG tablet Take 40 mg by mouth daily. 02/09/23   [provider]  omeprazole (PRILOSEC) 20 MG capsule Take 20 mg by mouth daily.    [provider]  sildenafil  (VIAGRA ) 100 MG tablet Take 1 tablet 1 hour prior to intercourse. 01/02/23   Stoioff, Glendia BROCKS, MD  tadalafil  (CIALIS ) 5 MG tablet TAKE 1 TABLET (5 MG TOTAL) BY MOUTH DAILY AS NEEDED FOR ERECTILE DYSFUNCTION 09/17/23   Stoioff, Glendia BROCKS, MD    Family History History reviewed. No pertinent family history.  Social History Social History   Tobacco Use   Smoking status: Former    Passive exposure: Past   Smokeless tobacco: Never  Vaping Use   Vaping status: Never Used  Substance Use Topics   Alcohol use: Yes  Comment: occasional   Drug use: No     Allergies   Penicillins   Review of Systems Review of Systems  Constitutional:  Negative for chills and fever.  Eyes:  Positive for photophobia, pain and redness. Negative for visual disturbance.     Physical Exam Triage Vital Signs ED Triage Vitals  Encounter Vitals Group     BP 03/05/24 1948 115/72     Girls Systolic BP Percentile --      Girls Diastolic BP Percentile --      Boys Systolic BP Percentile --      Boys Diastolic BP Percentile --      Pulse Rate 03/05/24 1948 76     Resp 03/05/24 1948 16     Temp 03/05/24 1948 97.7 F (36.5 C)     Temp src --      SpO2 03/05/24  1948 98 %     Weight --      Height --      Head Circumference --      Peak Flow --      Pain Score 03/05/24 1943 4     Pain Loc --      Pain Education --      Exclude from Growth Chart --    No data found.  Updated Vital Signs BP 115/72   Pulse 76   Temp 97.7 F (36.5 C)   Resp 16   SpO2 98%   Visual Acuity Right Eye Distance: NA Left Eye Distance: 20/40 Bilateral Distance: NA  Right Eye Near:   Left Eye Near:    Bilateral Near:     Physical Exam Constitutional:      General: He is not in acute distress. HENT:     Mouth/Throat:     Mouth: Mucous membranes are moist.  Eyes:     General: Lids are normal.     Extraocular Movements: Extraocular movements intact.     Conjunctiva/sclera:     Right eye: Right conjunctiva is injected.     Pupils: Pupils are equal, round, and reactive to light.     Right eye: Fluorescein uptake present.      Comments: Corneal abrasion of right eye.  Right eye tearing and injected.  No purulent drainage.  Cardiovascular:     Rate and Rhythm: Normal rate and regular rhythm.  Pulmonary:     Effort: Pulmonary effort is normal. No respiratory distress.  Neurological:     Mental Status: He is alert.      UC Treatments / Results  Labs (all labs ordered are listed, but only abnormal results are displayed) Labs Reviewed - No data to display  EKG   Radiology No results found.  Procedures Procedures (including critical care time)  Medications Ordered in UC Medications - No data to display  Initial Impression / Assessment and Plan / UC Course  I have reviewed the triage vital signs and the nursing notes.  Pertinent labs & imaging results that were available during my care of the patient were reviewed by me and considered in my medical decision making (see chart for details).    Right corneal abrasion.  Treating with erythromycin  eye ointment.  ED precautions discussed.  Instructed patient to follow-up with his eye care  provider on Monday.  Education provided on corneal abrasion.  He agrees to plan of care.  Final Clinical Impressions(s) / UC Diagnoses   Final diagnoses:  Abrasion of right cornea, initial encounter     Discharge  Instructions      Use the antibiotic eyedrops as prescribed.    Follow-up with your eye care provider on Monday.    Go to the emergency department if you have acute eye pain, changes in your vision, or other concerning symptoms.        ED Prescriptions     Medication Sig Dispense Auth. Provider   erythromycin  ophthalmic ointment  (Status: Discontinued) Place a 1/2 inch ribbon of ointment into the lower eyelid four times a day for 7 days. 3.5 g Corlis Burnard DEL, NP   erythromycin  ophthalmic ointment Place a 1/2 inch ribbon of ointment into the lower eyelid four times a day for 7 days. 3.5 g Corlis Burnard DEL, NP      PDMP not reviewed this encounter.   Corlis Burnard DEL, NP 03/05/24 2016

## 2024-04-20 ENCOUNTER — Ambulatory Visit: Admitting: Urology

## 2024-04-20 ENCOUNTER — Encounter: Payer: Self-pay | Admitting: Urology

## 2024-04-20 VITALS — BP 129/69 | HR 80 | Ht 76.0 in | Wt 339.0 lb

## 2024-04-20 DIAGNOSIS — N2889 Other specified disorders of kidney and ureter: Secondary | ICD-10-CM

## 2024-04-20 DIAGNOSIS — N5201 Erectile dysfunction due to arterial insufficiency: Secondary | ICD-10-CM | POA: Diagnosis not present

## 2024-04-20 MED ORDER — TADALAFIL 20 MG PO TABS: ORAL_TABLET | ORAL | 0 refills | Status: DC

## 2024-04-20 NOTE — Progress Notes (Signed)
 04/20/2024 4:38 PM   Edwin Ruiz Dec 10, 1955 [id number]  Referring provider: Douglas Rule, MD [phone] [facility] [address] [place],  [place] [address]  Chief Complaint  Patient presents with   Other    Renal mass    HPI: Edwin Ruiz is a 68 y.o. male referred for evaluation of a right renal mass.  Renal ultrasound ordered by nephrology and performed 03/01/24 which showed a 3.7 x 3.2 x 3.5 cm complex renal cyst in the right upper pole.  A renal mass protocol CT abdomen was recommended.  A CT abdomen pelvis without contrast was performed which showed in incompletely characterized 3.8 x 3.6 cm hypodense mass in the upper pole of the right kidney which was present in 2023 and measured 3.3 x 3.1 cm Denies flank, abdominal or pelvic pain Prior visit with me 01/02/2023 for erectile dysfunction and was given sildenafil  100 mg which he states has been effective but does have significant headache Presently on tadalafil  5 mg daily for BPH with stable lower urinary tract symptoms    PMH: Past Medical History:  Diagnosis Date   Acute cholecystitis due to biliary calculus 03/20/2022   Arthritis    Asthma    GERD (gastroesophageal reflux disease)    Hematuria    Hypertension    Presence of permanent cardiac pacemaker    Sleep apnea    Urticaria     Surgical History: Past Surgical History:  Procedure Laterality Date   APPENDECTOMY     COLONOSCOPY WITH PROPOFOL  N/A 02/05/2016   Procedure: COLONOSCOPY WITH PROPOFOL ;  Surgeon: Gladis RAYMOND Mariner, MD;  Location: [facility] ENDOSCOPY;  Service: Endoscopy;  Laterality: N/A;   COLONOSCOPY WITH PROPOFOL  N/A 09/03/2022   Procedure: COLONOSCOPY WITH PROPOFOL ;  Surgeon: Maryruth Ole DASEN, MD;  Location: [facility] ENDOSCOPY;  Service: Endoscopy;  Laterality: N/A;   FOOT SURGERY     INSERT / REPLACE / REMOVE PACEMAKER     PPM GENERATOR CHANGEOUT N/A 01/16/2022   Procedure: PPM GENERATOR CHANGEOUT;  Surgeon: Ammon Blunt, MD;  Location: [facility]  INVASIVE CV LAB;  Service: Cardiovascular;  Laterality: N/A;   TONSILLECTOMY      Home Medications:  Allergies as of 04/20/2024       Reactions   Penicillins         Medication List        Accurate as of April 20, 2024  4:38 PM. If you have any questions, ask your nurse or doctor.          acetaminophen  500 MG tablet Commonly known as: TYLENOL  Take 500 mg by mouth every 6 (six) hours as needed.   albuterol 108 (90 Base) MCG/ACT inhaler Commonly known as: VENTOLIN HFA   amLODipine 5 MG tablet Commonly known as: NORVASC Take by mouth.   cetirizine 10 MG tablet Commonly known as: ZYRTEC Take 1 tablet by mouth daily.   erythromycin  ophthalmic ointment Place a 1/2 inch ribbon of ointment into the lower eyelid four times a day for 7 days.   meloxicam  15 MG tablet Commonly known as: MOBIC  Take 15 mg by mouth daily.   olmesartan 40 MG tablet Commonly known as: BENICAR Take 40 mg by mouth daily.   omeprazole 20 MG capsule Commonly known as: PRILOSEC Take 20 mg by mouth daily.   rosuvastatin 5 MG tablet Commonly known as: CRESTOR Take 5 mg by mouth daily.   sildenafil  100 MG tablet Commonly known as: VIAGRA  Take 1 tablet 1 hour prior to intercourse.  tadalafil  5 MG tablet Commonly known as: CIALIS  TAKE 1 TABLET (5 MG TOTAL) BY MOUTH DAILY AS NEEDED FOR ERECTILE DYSFUNCTION        Allergies:  Allergies  Allergen Reactions   Penicillins     Family History: No family history on file.  Social History:  reports that he has quit smoking. He has been exposed to tobacco smoke. He has never used smokeless tobacco. He reports current alcohol use. He reports that he does not use drugs.   Physical Exam: BP 129/69   Pulse 80   Ht 6' 4 (1.93 m)   Wt (!) 339 lb (153.8 kg)   BMI 41.26 kg/m   Constitutional:  Alert, No acute distress. HEENT: [place] AT Respiratory: Normal respiratory effort, no increased work of breathing. Psychiatric: Normal mood and  affect.    Pertinent Imaging: RUS and CT images were personally reviewed and interpreted  US  RENAL  Narrative CLINICAL DATA:  Chronic renal disease.  EXAM: RENAL / URINARY TRACT ULTRASOUND COMPLETE  COMPARISON:  None Available.  FINDINGS: Right Kidney:  Renal measurements: 13.8 cm x 6.6 cm x 4.7 cm = volume: 229.0 mL. Echogenicity within normal limits. A 3.7 cm x 3.2 cm x 3.5 cm complex renal cyst is seen within the upper pole of the right kidney. No hydronephrosis is visualized.  Left Kidney:  Renal measurements: 12.0 cm x 6.6 cm x 6.8 cm = volume: 278.2 mL. Diffusely increased echogenicity of the left renal parenchyma is seen. No mass or hydronephrosis visualized.  Bladder:  Appears normal for degree of bladder distention.  Other:  The study is technically limited secondary to the patient's body habitus, as per the ultrasound technologist.  IMPRESSION: 1. Complex right renal cyst. Correlation with nonemergent renal protocol abdomen and pelvis CT is recommended. 2. Echogenic left kidney which may represent sequelae associated with medical renal disease.   Electronically Signed By: Suzen Dials M.D. On: 02/18/2024 21:40    Assessment & Plan:    1.  Indeterminant right renal mass Mass present in 2023 and has increased in size slightly.  Needs a renal mass protocol study to see if suspicious for malignancy He has a pacemaker which is MRI compatible and renal mass protocol MR abdomen/pelvis was ordered.  Will contact with results  2.  Erectile dysfunction Bothersome headache with sildenafil  Will switch to tadalafil  20 mg 1 hour prior to intercourse   Glendia JAYSON Barba, MD  [facility] [address], [address] [place], [place] [address] [phone]

## 2024-04-22 NOTE — Telephone Encounter (Signed)
 This encounter was created in error - please disregard.

## 2024-04-28 ENCOUNTER — Encounter: Payer: Self-pay | Admitting: Pulmonary Disease

## 2024-05-05 ENCOUNTER — Ambulatory Visit
Admission: RE | Admit: 2024-05-05 | Discharge: 2024-05-05 | Disposition: A | Discharge status: Home / Self Care | Source: Ambulatory Visit | Attending: Pulmonary Disease | Admitting: Pulmonary Disease

## 2024-05-05 DIAGNOSIS — R911 Solitary pulmonary nodule: Secondary | ICD-10-CM

## 2024-05-06 ENCOUNTER — Other Ambulatory Visit: Payer: Self-pay | Admitting: Urology

## 2024-05-06 DIAGNOSIS — N2889 Other specified disorders of kidney and ureter: Secondary | ICD-10-CM

## 2024-05-06 NOTE — Telephone Encounter (Signed)
 Pt returned call and said he prefers [facility] for MRI of abdomen/pelvis.

## 2024-05-18 ENCOUNTER — Ambulatory Visit: Payer: Self-pay | Admitting: Pulmonary Disease

## 2024-05-21 NOTE — Progress Notes (Signed)
 Called and spoke to pt - advised of CT results per Dr. Theophilus. Pt verbalized understanding and requested that a letter be mailed to him with the number to call to get scheduled as he has trouble using MyChart. Letter mailed, NFN.

## 2024-05-24 NOTE — Telephone Encounter (Signed)
 Copied from CRM [id number]. Topic: General - Other >> May 24, 2024 10:57 AM Isabell A wrote: Reason for CRM: Patient returning phone call from [facility] - states he is supposed to get scheduled but doesn't need an appointment right away but would like to know which provider was recommended for him to be seen by.   Callback number: [phone]  Front staff please schedule the pt with nodule Doctor. RB, JD, Dr. Zaida or Dr. Catherine Thanks!

## 2024-05-25 NOTE — Progress Notes (Signed)
 ATC left VM sent LTR

## 2024-06-14 ENCOUNTER — Encounter

## 2024-06-23 ENCOUNTER — Other Ambulatory Visit: Payer: Self-pay | Admitting: Urology

## 2024-07-02 ENCOUNTER — Other Ambulatory Visit: Payer: Self-pay | Admitting: Urology

## 2024-07-07 ENCOUNTER — Ambulatory Visit

## 2024-07-07 VITALS — BP 99/67 | HR 67 | Temp 97.9°F | Ht 76.0 in | Wt 339.0 lb

## 2024-07-07 DIAGNOSIS — R600 Localized edema: Secondary | ICD-10-CM | POA: Diagnosis not present

## 2024-07-07 DIAGNOSIS — I8390 Asymptomatic varicose veins of unspecified lower extremity: Secondary | ICD-10-CM | POA: Diagnosis not present

## 2024-07-07 DIAGNOSIS — Z6841 Body Mass Index (BMI) 40.0 and over, adult: Secondary | ICD-10-CM

## 2024-07-07 DIAGNOSIS — Z87891 Personal history of nicotine dependence: Secondary | ICD-10-CM

## 2024-07-07 DIAGNOSIS — R911 Solitary pulmonary nodule: Secondary | ICD-10-CM | POA: Diagnosis not present

## 2024-07-07 DIAGNOSIS — I9589 Other hypotension: Secondary | ICD-10-CM

## 2024-07-07 DIAGNOSIS — R918 Other nonspecific abnormal finding of lung field: Secondary | ICD-10-CM

## 2024-07-07 NOTE — Progress Notes (Signed)
 Synopsis: Referred in Aug 2023 for lung nodules by Valora Lynwood FALCON, MD  Subjective:   PATIENT ID: Edwin Ruiz GENDER: male DOB: 29-Mar-1956, MRN: [id number]  Chief Complaint  Patient presents with   Follow-up    Pt states since LOV breathing has been fine No SOB NO cough Pt stated his Blood Pressure has been running low and has to wear a monitor     This is a 68 year old gentleman, past medical history of GERD, hypertension, obesity.  Patient had abdominal pain was seen in the emergency department had a CT chest abdomen pelvis completed in 02/2022.  Patient's father had a history of an abdominal AAA.  Patient was found to have an incidental right middle lobe 5 mm pulmonary nodule.  He has since followed in this pulmonary clinic. Used to follow up with Dr Brenna. He is a former smoker.  Initial appointment with me 06/2024.     Had a CT chest 04/2024 which showed the 5 mm right middle lobe to be stable and a 2 mm left lung nodule He is also getting regular CT chest with contrast from cardiology clinic for 4.5 cm aortic aneurysm.   Reports chronic b/l leg swelling, right more than left  Currently having issues with hypotension and dizziness Stopped taking amlodipine But still taking ARBs    Past Medical History:  Diagnosis Date   Acute cholecystitis due to biliary calculus 03/20/2022   Arthritis    Asthma    GERD (gastroesophageal reflux disease)    Hematuria    Hypertension    Presence of permanent cardiac pacemaker    Sleep apnea    Urticaria      History reviewed. No pertinent family history.   Past Surgical History:  Procedure Laterality Date   APPENDECTOMY     COLONOSCOPY WITH PROPOFOL  N/A 02/05/2016   Procedure: COLONOSCOPY WITH PROPOFOL ;  Surgeon: Gladis RAYMOND Mariner, MD;  Location: [facility] ENDOSCOPY;  Service: Endoscopy;  Laterality: N/A;   COLONOSCOPY WITH PROPOFOL  N/A 09/03/2022   Procedure: COLONOSCOPY WITH PROPOFOL ;  Surgeon: Maryruth Ole DASEN, MD;   Location: [facility] ENDOSCOPY;  Service: Endoscopy;  Laterality: N/A;   FOOT SURGERY     INSERT / REPLACE / REMOVE PACEMAKER     PPM GENERATOR CHANGEOUT N/A 01/16/2022   Procedure: PPM GENERATOR CHANGEOUT;  Surgeon: Ammon Blunt, MD;  Location: [facility] INVASIVE CV LAB;  Service: Cardiovascular;  Laterality: N/A;   TONSILLECTOMY      Social History   Socioeconomic History   Marital status: Married    Spouse name: Not on file   Number of children: Not on file   Years of education: Not on file   Highest education level: Not on file  Occupational History   Not on file  Tobacco Use   Smoking status: Former    Passive exposure: Past   Smokeless tobacco: Never  Vaping Use   Vaping status: Never Used  Substance and Sexual Activity   Alcohol use: Yes    Comment: occasional   Drug use: No   Sexual activity: Not on file  Other Topics Concern   Not on file  Social History Narrative   Not on file   Social Drivers of Health   Financial Resource Strain: Low Risk  (11/19/2023)   Received from [facility]   Overall Financial Resource Strain (CARDIA)    Difficulty of Paying Living Expenses: Not hard at all  Food Insecurity: No Food Insecurity (11/19/2023)   Received [facility]   Hunger Vital Sign    Within the past 12 months, you worried that your food would run out before you got the money to buy more.: Never true    Within the past 12 months, the food you bought just didn't last and you didn't have money to get more.: Never true  Transportation Needs: No Transportation Needs (11/19/2023)   Received from [facility] - Transportation    In the past 12 months, has lack of transportation kept you from medical appointments or from getting medications?: No    Lack of Transportation (Non-Medical): No  Physical Activity: Not on file  Stress: Not on file  Social Connections: Not on file  Intimate Partner Violence: Not on  file     Allergies  Allergen Reactions   Penicillins      Outpatient Medications Prior to Visit  Medication Sig Dispense Refill   acetaminophen  (TYLENOL ) 500 MG tablet Take 500 mg by mouth every 6 (six) hours as needed.     albuterol (VENTOLIN HFA) 108 (90 Base) MCG/ACT inhaler      cetirizine (ZYRTEC) 10 MG tablet Take 1 tablet by mouth daily.     meloxicam  (MOBIC ) 15 MG tablet Take 15 mg by mouth daily.     olmesartan (BENICAR) 40 MG tablet Take 40 mg by mouth daily.     omeprazole (PRILOSEC) 20 MG capsule Take 20 mg by mouth daily.     rosuvastatin (CRESTOR) 5 MG tablet Take 5 mg by mouth daily.     amLODipine (NORVASC) 5 MG tablet Take by mouth. (Patient not taking: Reported on 07/07/2024)     tadalafil  (CIALIS ) 20 MG tablet TAKE 1 TAB BY MOUTH 1 HOUR PRIOR TO INTERCOURSE (Patient not taking: Reported on 07/07/2024) 10 tablet 0   tadalafil  (CIALIS ) 5 MG tablet TAKE 1 TABLET (5 MG TOTAL) BY MOUTH DAILY AS NEEDED FOR ERECTILE DYSFUNCTION (Patient not taking: Reported on 07/07/2024) 30 tablet 3   No facility-administered medications prior to visit.    Review of Systems  Constitutional:  Negative for chills, fever, malaise/fatigue and weight loss.  HENT:  Negative for hearing loss, sore throat and tinnitus.   Eyes:  Negative for blurred vision and double vision.  Respiratory:  Negative for cough, hemoptysis, sputum production, shortness of breath, wheezing and stridor.   Cardiovascular:  Negative for chest pain, palpitations, orthopnea, leg swelling and PND.  Gastrointestinal:  Negative for abdominal pain, constipation, diarrhea, heartburn, nausea and vomiting.  Genitourinary:  Negative for dysuria, hematuria and urgency.  Musculoskeletal:  Negative for joint pain and myalgias.  Skin:  Negative for itching and rash.  Neurological:  Negative for dizziness, tingling, weakness and headaches.  Endo/Heme/Allergies:  Negative for environmental allergies. Does not bruise/bleed easily.   Psychiatric/Behavioral:  Negative for depression. The patient is not nervous/anxious and does not have insomnia.   All other systems reviewed and are negative.    Objective:  Physical Exam Vitals reviewed.  Constitutional:      General: He is not in acute distress.    Appearance: He is well-developed. He is obese.  HENT:     Head: Normocephalic and atraumatic.  Eyes:     General: No scleral icterus.    Conjunctiva/sclera: Conjunctivae normal.     Pupils: Pupils are equal, round, and reactive to light.  Neck:     Vascular: No JVD.     Trachea: No tracheal deviation.  Cardiovascular:     Rate  and Rhythm: Normal rate and regular rhythm.     Heart sounds: Normal heart sounds. No murmur heard. Pulmonary:     Effort: Pulmonary effort is normal. No tachypnea, accessory muscle usage or respiratory distress.     Breath sounds: No stridor. No wheezing, rhonchi or rales.  Abdominal:     General: There is no distension.     Palpations: Abdomen is soft.     Tenderness: There is no abdominal tenderness.  Musculoskeletal:        General: No tenderness.     Cervical back: Neck supple.     Right lower leg: Edema (worse) present.     Left lower leg: Edema present.  Lymphadenopathy:     Cervical: No cervical adenopathy.  Skin:    General: Skin is warm and dry.     Capillary Refill: Capillary refill takes less than 2 seconds.     Findings: No rash.  Neurological:     Mental Status: He is alert and oriented to person, place, and time.  Psychiatric:        Behavior: Behavior normal.      Vitals:   07/07/24 1444  BP: 99/67  Pulse: 67  Temp: 97.9 F (36.6 C)  TempSrc: Oral  SpO2: 97%  Weight: (!) 339 lb (153.8 kg)  Height: 6' 4 (1.93 m)    97% on RA BMI Readings from Last 3 Encounters:  07/07/24 41.26 kg/m  04/20/24 41.26 kg/m  01/18/24 45.04 kg/m   Wt Readings from Last 3 Encounters:  07/07/24 (!) 339 lb (153.8 kg)  04/20/24 (!) 339 lb (153.8 kg)  01/18/24 (!) 370  lb (167.8 kg)     CBC    Component Value Date/Time   WBC 7.7 01/18/2024 1825   RBC 3.98 (L) 01/18/2024 1825   HGB 13.2 01/18/2024 1825   HGB 13.2 02/09/2014 1403   HCT 36.2 (L) 01/18/2024 1825   HCT 37.7 (L) 02/09/2014 1403   PLT 215 01/18/2024 1825   PLT 186 02/09/2014 1403   MCV 91.0 01/18/2024 1825   MCV 91 02/09/2014 1403   MCH 33.2 01/18/2024 1825   MCHC 36.5 (H) 01/18/2024 1825   RDW 13.3 01/18/2024 1825   RDW 14.5 02/09/2014 1403   LYMPHSABS 1.6 02/09/2014 1403   MONOABS 0.4 02/09/2014 1403   EOSABS 0.1 02/09/2014 1403   BASOSABS 0.0 02/09/2014 1403     Chest Imaging: CT chest 04/2024 Unchanged tiny bilateral pulmonary nodules, including a 0.4 cm nodule of the right middle lobe (series 9, image 78) and a 0.2 cm nodule in the left lower lobe (series 9, image 117). No pleural effusion or pneumothorax.  Reviewed ct chest 02/2022, 10/2022, 10/2023 and 04/2024  Pulmonary Functions Testing Results:     No data to display              Assessment & Plan:     ICD-10-CM   1. Lung nodules  R91.8     2. Former smoker  Z87.891     3. BMI 40.0-44.9, adult (HCC)  Z68.41     4. Pedal edema  R60.0     5. Other specified hypotension  I95.89      Stable 5 mm right middle lobe since 02/2022 Unchanged In size Former smoker quit 15 years ago Has aortic aneurysm and undergoing ct with contrast with cardiology clinic  Will f/up lung nodule on contrasted ct chest from cardiology clinic  Advised pt to discuss BP meds with heart doctor Likely can  lower ARB dose  DVT previously ruled out but right leg swelling chronically worse than left Advised to keep feet up Has varicose veins Denies dyspnea  Losing weight  On semaglutide  Congratulated on quitting smoking  Mubarak Bevens Pleas, MD [facility] 07/07/2024 3:18 PM

## 2024-07-07 NOTE — Patient Instructions (Signed)
 It was a pleasure to see you today. We will see you after your next CT chest Call your heart doctor office to discuss the BP medication doses

## 2024-08-19 ENCOUNTER — Other Ambulatory Visit: Payer: Self-pay | Admitting: Urology

## 2024-08-20 ENCOUNTER — Other Ambulatory Visit: Payer: Self-pay | Admitting: *Deleted

## 2024-08-20 ENCOUNTER — Telehealth: Payer: Self-pay | Admitting: Urology

## 2024-08-20 MED ORDER — TADALAFIL 5 MG PO TABS: 5.0000 mg | ORAL_TABLET | Freq: Every day | ORAL | 3 refills | Status: AC

## 2024-08-20 NOTE — Telephone Encounter (Signed)
 Tadalafil  5 mg sent in . Patient states he take it daily

## 2024-08-20 NOTE — Telephone Encounter (Signed)
 Pt called asking about refill for Tadalafil .  He said [facility] told him yesterday it was denied.
